# Patient Record
Sex: Female | Born: 1940 | Race: White | Hispanic: No | Smoking: Former smoker
Health system: Southern US, Community
[De-identification: ages and names within clinical notes are randomized; demographics above are authoritative.]

## PROBLEM LIST (undated history)

## (undated) DIAGNOSIS — J449 Chronic obstructive pulmonary disease, unspecified: Secondary | ICD-10-CM

## (undated) DIAGNOSIS — I2510 Atherosclerotic heart disease of native coronary artery without angina pectoris: Secondary | ICD-10-CM

## (undated) DIAGNOSIS — I10 Essential (primary) hypertension: Secondary | ICD-10-CM

---

## 2008-02-03 ENCOUNTER — Ambulatory Visit: Payer: Self-pay | Admitting: Family Medicine

## 2009-01-31 ENCOUNTER — Ambulatory Visit: Payer: Self-pay | Admitting: Urology

## 2009-02-10 ENCOUNTER — Ambulatory Visit: Payer: Self-pay | Admitting: Orthopedic Surgery

## 2009-08-04 ENCOUNTER — Ambulatory Visit: Payer: Self-pay | Admitting: Gastroenterology

## 2009-10-04 ENCOUNTER — Ambulatory Visit: Payer: Self-pay | Admitting: Family Medicine

## 2010-10-19 ENCOUNTER — Ambulatory Visit: Payer: Self-pay | Admitting: Family Medicine

## 2010-10-24 ENCOUNTER — Ambulatory Visit: Payer: Self-pay | Admitting: Family Medicine

## 2011-02-16 ENCOUNTER — Ambulatory Visit: Payer: Self-pay | Admitting: Specialist

## 2011-06-05 ENCOUNTER — Ambulatory Visit: Payer: Self-pay | Admitting: Specialist

## 2012-06-03 ENCOUNTER — Ambulatory Visit: Payer: Self-pay | Admitting: Specialist

## 2012-06-03 LAB — CREATININE, SERUM
EGFR (African American): >60
EGFR (Non-African Amer.): >60

## 2012-09-17 ENCOUNTER — Ambulatory Visit: Payer: Self-pay | Admitting: Specialist

## 2012-11-19 ENCOUNTER — Ambulatory Visit: Payer: Self-pay | Admitting: Family Medicine

## 2013-01-21 ENCOUNTER — Ambulatory Visit: Payer: Self-pay | Admitting: Specialist

## 2013-09-08 ENCOUNTER — Ambulatory Visit: Payer: Self-pay | Admitting: Urology

## 2013-09-08 LAB — DIFFERENTIAL
Basophil #: 0 10*3/uL (ref 0.0–0.1)
Basophil %: 0.6 %
Eosinophil #: 0.3 10*3/uL (ref 0.0–0.7)
Eosinophil %: 3.3 %
Lymphocyte %: 28.8 %
Neutrophil #: 4.8 10*3/uL (ref 1.4–6.5)
Neutrophil %: 60.7 %

## 2013-09-08 LAB — CBC
HCT: 41.2 % (ref 35.0–47.0)
HGB: 13.6 g/dL (ref 12.0–16.0)
MCHC: 33 g/dL (ref 32.0–36.0)
Platelet: 238 10*3/uL (ref 150–440)
RBC: 4.61 10*6/uL (ref 3.80–5.20)
RDW: 13.6 % (ref 11.5–14.5)
WBC: 7.8 10*3/uL (ref 3.6–11.0)

## 2013-09-08 LAB — BASIC METABOLIC PANEL
Anion Gap: 2 — ABNORMAL LOW (ref 7–16)
BUN: 18 mg/dL (ref 7–18)
Chloride: 102 mmol/L (ref 98–107)
Co2: 35 mmol/L — ABNORMAL HIGH (ref 21–32)
Creatinine: 0.67 mg/dL (ref 0.60–1.30)
EGFR (Non-African Amer.): >60
Glucose: 90 mg/dL (ref 65–99)
Osmolality: 279 (ref 275–301)
Potassium: 4.1 mmol/L (ref 3.5–5.1)
Sodium: 139 mmol/L (ref 136–145)

## 2013-09-15 ENCOUNTER — Ambulatory Visit: Payer: Self-pay | Admitting: Urology

## 2013-12-01 ENCOUNTER — Ambulatory Visit: Payer: Self-pay | Admitting: Family Medicine

## 2014-01-06 ENCOUNTER — Ambulatory Visit: Payer: Self-pay | Admitting: Specialist

## 2014-04-23 IMAGING — CT CT CHEST W/O CM
1 of 2 series · 14 of 32 positions shown, 18 images · non-contrast
Comparison: none

REASON FOR EXAM: Pulmonary nodules
COMMENTS:

[Series 2: chest w/o 3.0 i31f 2 · axial · non-contrast · 0.65mm/px · z∈[-679,-442]mm · 14 of 95 slices shown, 18 images]
[im 8/95  mediastinal]
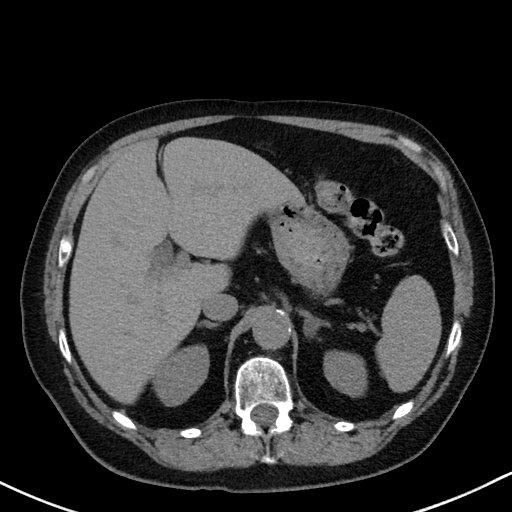
[im 8/95  lung]
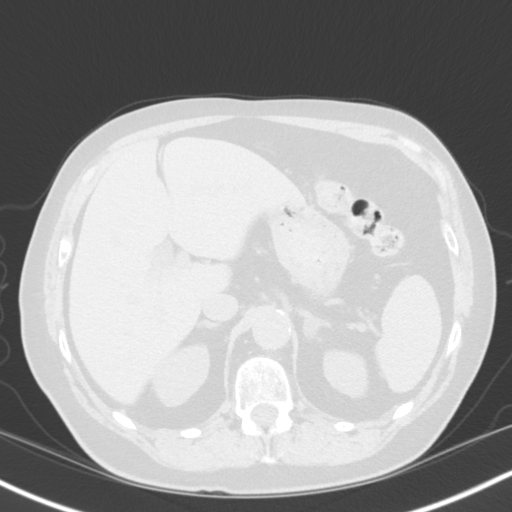
[im 15/95  lung]
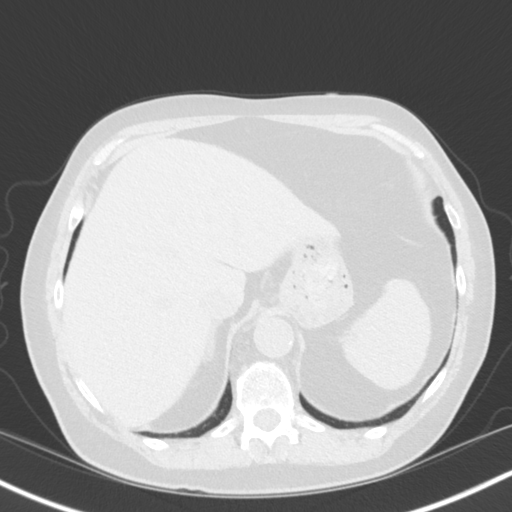
[im 22/95  lung]
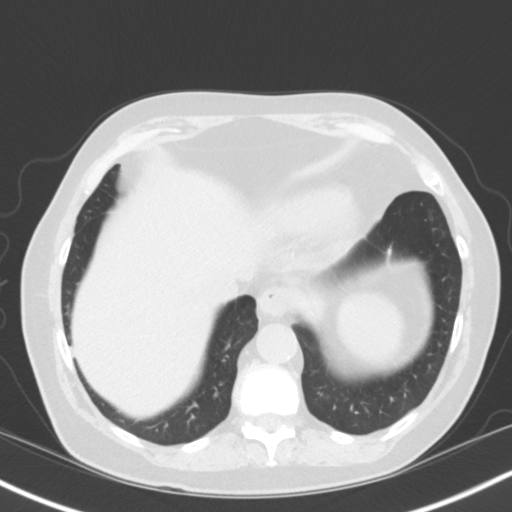
[im 29/95  lung]
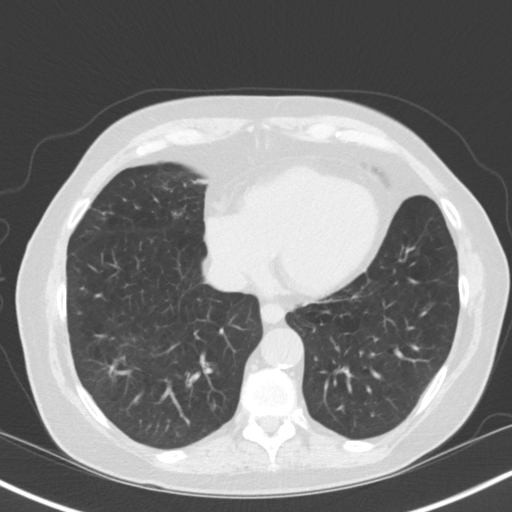
[im 37/95  mediastinal]
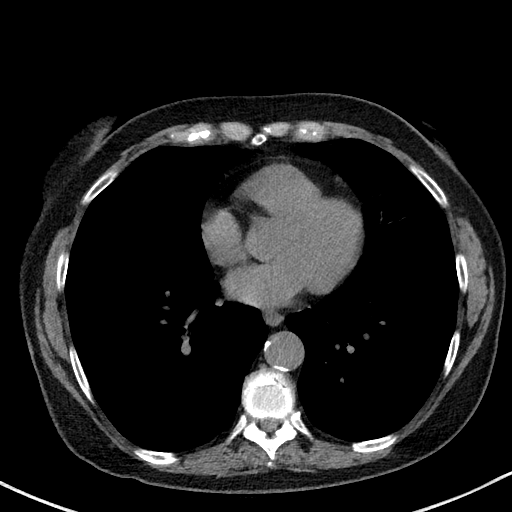
[im 37/95  lung]
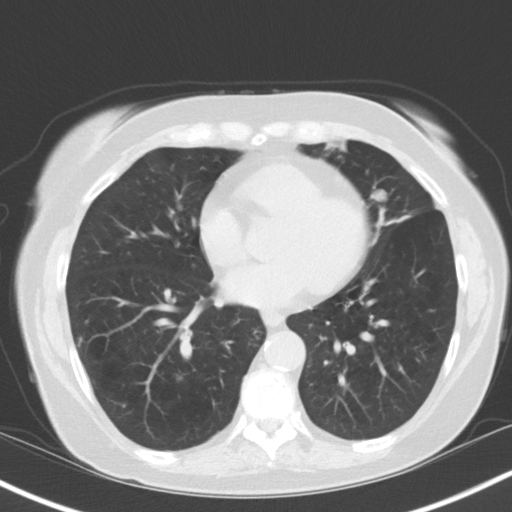
[im 44/95  lung]
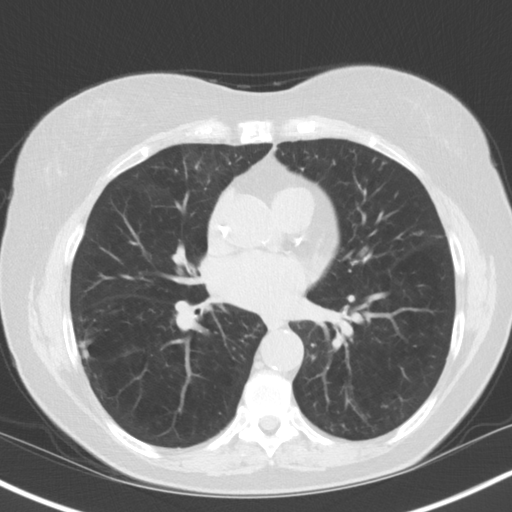
[im 45/95  lung]
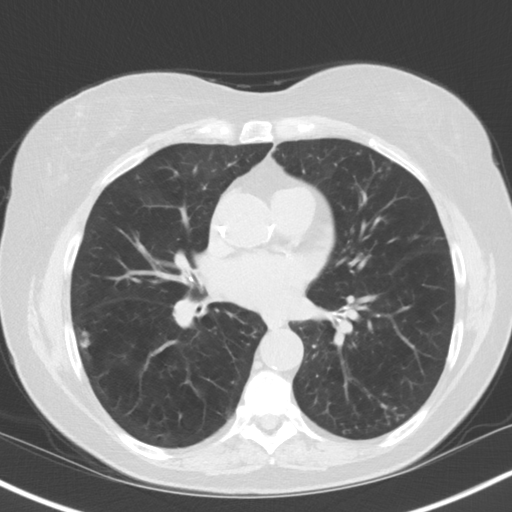
[im 48/95  lung]
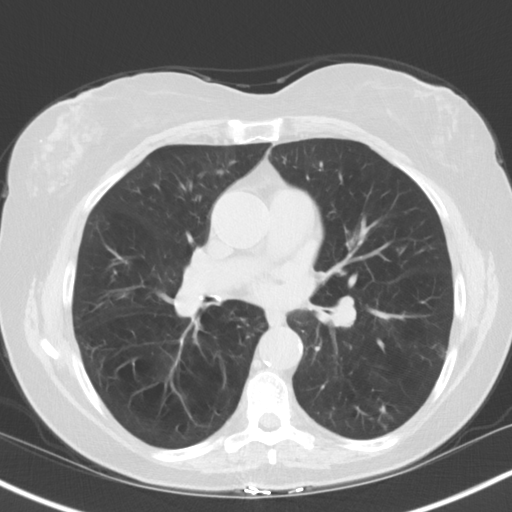
[im 51/95  mediastinal]
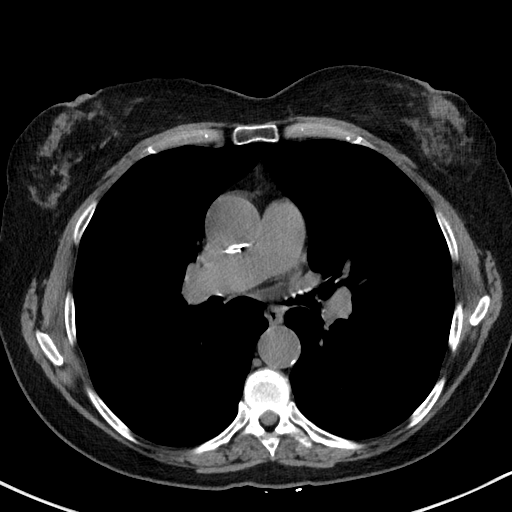
[im 51/95  lung]
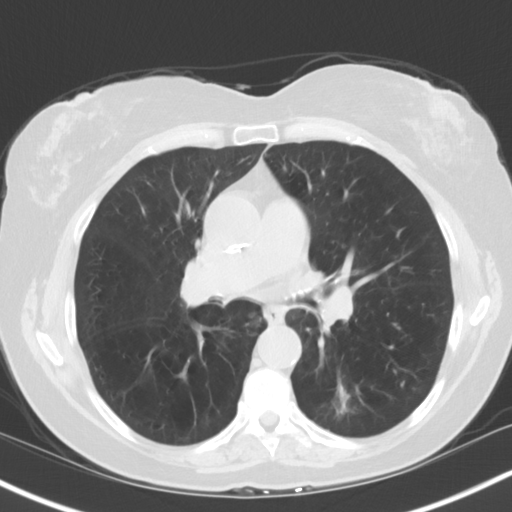
[im 58/95  lung]
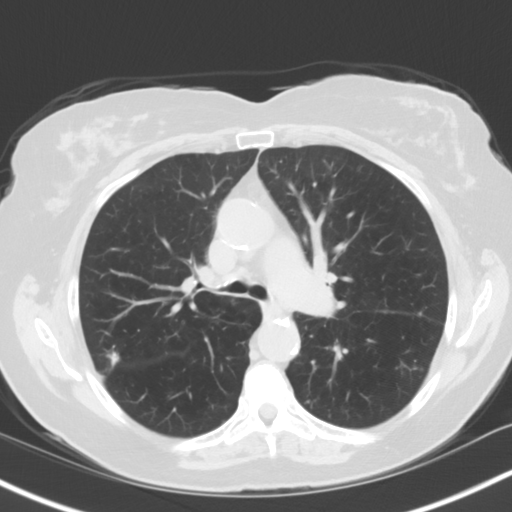
[im 66/95  lung]
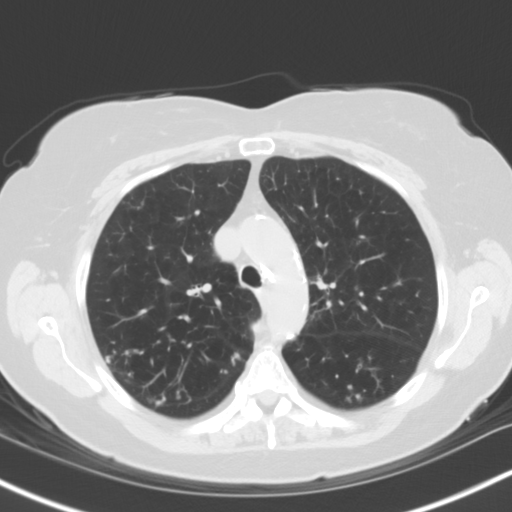
[im 73/95  lung]
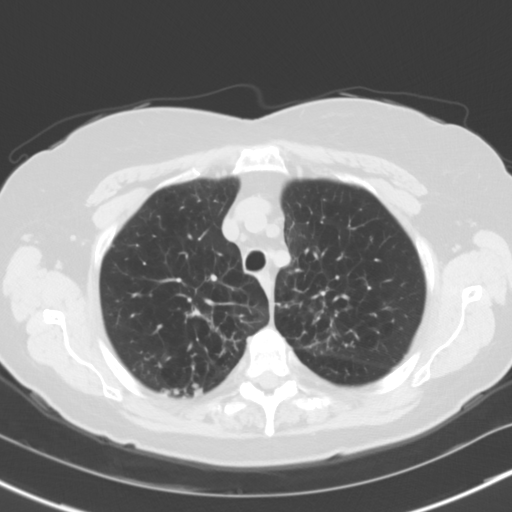
[im 80/95  mediastinal]
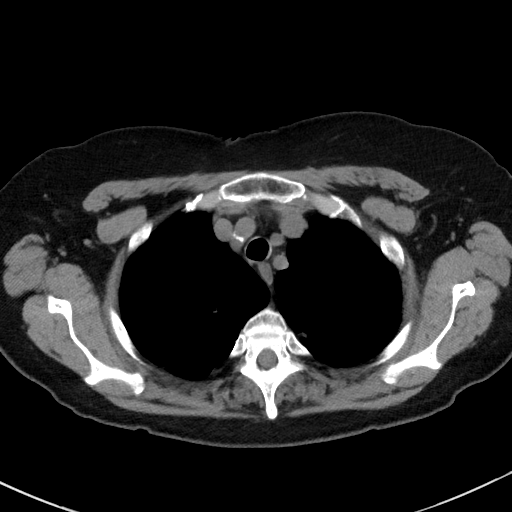
[im 80/95  lung]
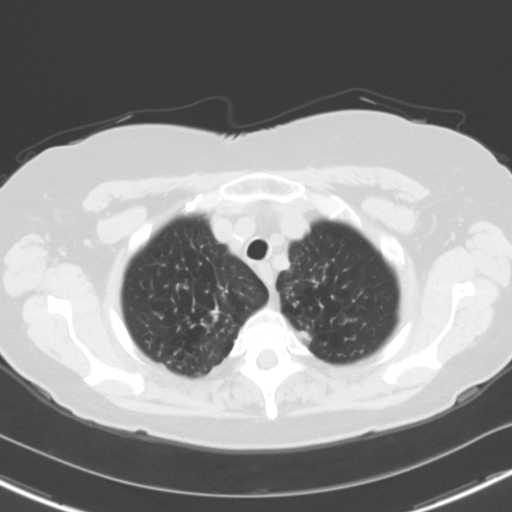
[im 87/95  lung]
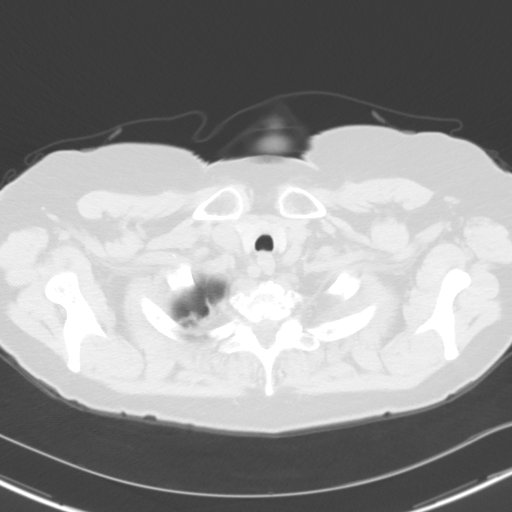

[14 of 32 positions shown; findings below may reference images not displayed]

PROCEDURE:     KCT - KCT CHEST WITHOUT CONTRAST  - September 17, 2012  [DATE]

RESULT:     Chest CT without contrast is reconstructed at 3.0 mm slice
thickness and compared to the previous examinations dated 03 June, 2012
and 05 June, 2011.

Emphysematous lung disease is present bilaterally. There is a stable
smoothly marginated elliptical nodular density in the lingula measuring
approximately 10.8 mm x 8.5 mm on image #58. This visually is stable. There
is a new small area of nodular density anterior to the superior portion of
the major fissure on the left side on image #20 measuring 6 mm. Scattered
areas of nodularity are seen fairly diffusely but somewhat more concentrated
in the posterior right upper lobe especially in the area of image #23. There
is slightly decreased irregular density in the right upper lobe anterior to
the major fissure along the chest wall laterally. There is an irregularly
marginated area of nodular density in the left lower lobe on images #40 to
image #45, which is slightly more prominent compared to the most recent
study. This is difficult to accurately measure given the significant
marginal irregularity. There is also new irregular nodular density in the
paraspinal region of the right lower lobe on image #28 measuring up to
mm by 4.6 mm. There is nodularity in the superior segment of the left lower
lobe not seen on the most recent study visualized today on image #27 and
measuring 3.7 mm at lung window settings. There does not appear to be
pneumothorax, definite pneumonia or effusion. Pleural-based nodular density
on image #60 in the anterior paramediastinal lingula measures as much as 14
mm parallel to the pleura and appears to be unchanged. There is no
mediastinal or hilar mass. Nonenlarged mediastinal and hilar lymph nodes are
present. The included upper abdominal structures appear unremarkable.
IMPRESSION: 1. Findings consistent with emphysematous lung disease and chronic pulmonary
disease of multiple areas of nodular density including some new areas of
nodularity. Other areas appear to be stable or slightly improved. Findings
can be consistent with chronic pulmonary disease and some areas of fibrosis.
Close clinical and CT follow-up is recommended. The appearance has worsened
compared to both of the two previous exams. The study is not definitely
indicative of malignant disease but that is a strong consideration requiring
close follow-up.

[REDACTED]

## 2015-01-21 NOTE — Op Note (Signed)
PATIENT NAME:  Autumn Moore, Autumn Moore MR#:  [id number] DATE OF BIRTH:  [birth year]  DATE OF PROCEDURE:  09/15/2013  PRINCIPAL DIAGNOSES: Bladder tumor, bladder calculi.   POSTOPERATIVE DIAGNOSES: Bladder tumor, bladder calculi.   PROCEDURE: Cystoscopy, bladder biopsy, removal of bladder stones, mitomycin bladder instillation.   SURGEON: Edrick Oh, M.D.   ANESTHESIA: Spinal.  INDICATIONS: The patient is a 74 year old white female with a history of bladder cancer. She underwent recent surveillance cystoscopy demonstrating 2 raised irregular early papillary-appearing lesions on the left lateral wall just behind the ureteral orifice. No other appreciable abnormalities were noted except for multiple small stones on the bladder base and a small cystocele region. The patient presents for biopsy and removal of the bladder stones.   DESCRIPTION OF PROCEDURE: After informed consent was obtained, the patient was taken to the operating room and placed in the dorsal lithotomy position under spinal anesthesia with adequate levels. The patient was then prepped and draped in the usual standard fashion. The 22-French rigid cystoscope was introduced into the urethra under direct vision with no urethral abnormalities noted. Upon entering the bladder, the mucosa was inspected in its entirety. There were approximately 2 lesions each 7 to 8 mm in size on the left lateral wall just behind the left ureteral orifice that were raised with early papillary-appearing irregularities. Prominent hypervascularity was also present. These findings are worrisome for early papillary tumor. No additional bladder abnormalities were noted under direct visualization. The narrow band imaging was utilized for further evaluation. A third area was noted on the posterior bladder wall just slightly right of midline. Cold cup biopsy forceps were utilized to remove the 2 left lateral lesions. A biopsy was also taken of the posterior lesion. The areas were  then extensively cauterized utilizing the Bugbee electrode. The bladder was then irrigated and the small stones were removed in their entirety. These were collected and will be sent for stone analysis. The pathology specimens will also be sent for pathology analysis, labeled left lateral wall and posterior bladder wall. Upon completion of the procedure, the bladder was drained, the cystoscope was removed, and mitomycin 20 mg was reconstituted in 50 mL of sterile water. It was instilled into the urinary bladder through an 18-French red rubber catheter. Proper chemotherapy precautions were followed. The catheter was removed upon instillation. The patient was then taken to the recovery room in stable condition. There were no problems or complications. The patient tolerated the procedure well. ____________________________ Denice Bors. Jacqlyn Larsen, MD bsc:sb D: 09/15/2013 14:08:27 ET T: 09/15/2013 15:16:33 ET JOB#: [id number]  cc: Denice Bors. Jacqlyn Larsen, MD, <Dictator> Denice Bors Rahkeem Senft MD ELECTRONICALLY SIGNED 09/15/2013 16:22

## 2015-03-07 ENCOUNTER — Ambulatory Visit: Payer: Medicare Other | Admitting: Anesthesiology

## 2015-03-07 ENCOUNTER — Encounter: Payer: Self-pay | Admitting: *Deleted

## 2015-03-07 ENCOUNTER — Encounter
Admission: RE | Disposition: A | Discharge status: Home / Self Care | Payer: Self-pay | Source: Ambulatory Visit | Attending: Gastroenterology

## 2015-03-07 ENCOUNTER — Ambulatory Visit
Admission: RE | Admit: 2015-03-07 | Discharge: 2015-03-07 | Disposition: A | Discharge status: Home / Self Care | Payer: Medicare Other | Source: Ambulatory Visit | Attending: Gastroenterology | Admitting: Gastroenterology

## 2015-03-07 DIAGNOSIS — K22719 Barrett's esophagus with dysplasia, unspecified: Secondary | ICD-10-CM | POA: Diagnosis present

## 2015-03-07 DIAGNOSIS — K5730 Diverticulosis of large intestine without perforation or abscess without bleeding: Secondary | ICD-10-CM | POA: Diagnosis not present

## 2015-03-07 DIAGNOSIS — Z7951 Long term (current) use of inhaled steroids: Secondary | ICD-10-CM | POA: Insufficient documentation

## 2015-03-07 DIAGNOSIS — Z86010 Personal history of colonic polyps: Secondary | ICD-10-CM | POA: Diagnosis present

## 2015-03-07 DIAGNOSIS — Z7982 Long term (current) use of aspirin: Secondary | ICD-10-CM | POA: Insufficient documentation

## 2015-03-07 DIAGNOSIS — D125 Benign neoplasm of sigmoid colon: Secondary | ICD-10-CM | POA: Insufficient documentation

## 2015-03-07 DIAGNOSIS — R159 Full incontinence of feces: Secondary | ICD-10-CM | POA: Insufficient documentation

## 2015-03-07 DIAGNOSIS — I10 Essential (primary) hypertension: Secondary | ICD-10-CM | POA: Diagnosis not present

## 2015-03-07 DIAGNOSIS — Z8551 Personal history of malignant neoplasm of bladder: Secondary | ICD-10-CM | POA: Diagnosis not present

## 2015-03-07 DIAGNOSIS — E785 Hyperlipidemia, unspecified: Secondary | ICD-10-CM | POA: Diagnosis not present

## 2015-03-07 DIAGNOSIS — I480 Paroxysmal atrial fibrillation: Secondary | ICD-10-CM | POA: Insufficient documentation

## 2015-03-07 DIAGNOSIS — Z85828 Personal history of other malignant neoplasm of skin: Secondary | ICD-10-CM | POA: Insufficient documentation

## 2015-03-07 DIAGNOSIS — M8580 Other specified disorders of bone density and structure, unspecified site: Secondary | ICD-10-CM | POA: Insufficient documentation

## 2015-03-07 DIAGNOSIS — A6000 Herpesviral infection of urogenital system, unspecified: Secondary | ICD-10-CM | POA: Diagnosis not present

## 2015-03-07 DIAGNOSIS — Z79899 Other long term (current) drug therapy: Secondary | ICD-10-CM | POA: Diagnosis not present

## 2015-03-07 DIAGNOSIS — K2270 Barrett's esophagus without dysplasia: Secondary | ICD-10-CM | POA: Insufficient documentation

## 2015-03-07 HISTORY — PX: COLONOSCOPY WITH PROPOFOL: SHX5780

## 2015-03-07 HISTORY — PX: ESOPHAGOGASTRODUODENOSCOPY: SHX5428

## 2015-03-07 HISTORY — DX: Essential (primary) hypertension: I10

## 2015-03-07 HISTORY — DX: Chronic obstructive pulmonary disease, unspecified: J44.9

## 2015-03-07 SURGERY — COLONOSCOPY WITH PROPOFOL
Anesthesia: General

## 2015-03-07 MED ORDER — PROPOFOL 10 MG/ML IV BOLUS
INTRAVENOUS | Status: DC | PRN
  Administered 2015-03-07: 50 mg via INTRAVENOUS

## 2015-03-07 MED ORDER — FENTANYL CITRATE (PF) 100 MCG/2ML IJ SOLN
INTRAMUSCULAR | Status: DC | PRN
  Administered 2015-03-07 (×2): 25 ug via INTRAVENOUS

## 2015-03-07 MED ORDER — SODIUM CHLORIDE 0.9 % IV SOLN
INTRAVENOUS | Status: DC
  Administered 2015-03-07: 08:00:00 via INTRAVENOUS

## 2015-03-07 MED ORDER — GLYCOPYRROLATE 0.2 MG/ML IJ SOLN
INTRAMUSCULAR | Status: DC | PRN
  Administered 2015-03-07: 0.2 mg via INTRAVENOUS

## 2015-03-07 MED ORDER — PHENYLEPHRINE HCL 10 MG/ML IJ SOLN
INTRAMUSCULAR | Status: DC | PRN
  Administered 2015-03-07: 100 ug via INTRAVENOUS

## 2015-03-07 MED ORDER — PROPOFOL INFUSION 10 MG/ML OPTIME
INTRAVENOUS | Status: DC | PRN
  Administered 2015-03-07: 75 ug/kg/min via INTRAVENOUS

## 2015-03-07 MED ORDER — LIDOCAINE HCL (CARDIAC) 20 MG/ML IV SOLN
INTRAVENOUS | Status: DC | PRN
  Administered 2015-03-07: 50 mg via INTRAVENOUS

## 2015-03-07 NOTE — Anesthesia Postprocedure Evaluation (Signed)
  Anesthesia Post-op Note  Patient: Autumn Moore  Procedure(s) Performed: Procedure(s): COLONOSCOPY WITH PROPOFOL (N/A) ESOPHAGOGASTRODUODENOSCOPY (EGD) (N/A)  Anesthesia type:General  Patient location: PACU  Post pain: Pain level controlled  Post assessment: Post-op Vital signs reviewed, Patient's Cardiovascular Status Stable, Respiratory Function Stable, Patent Airway and No signs of Nausea or vomiting  Post vital signs: Reviewed and stable  Last Vitals:  Filed Vitals:   03/07/15 0910  BP: 110/62  Pulse: 70  Temp:   Resp: 18    Level of consciousness: awake, alert  and patient cooperative  Complications: No apparent anesthesia complications

## 2015-03-07 NOTE — Transfer of Care (Signed)
Immediate Anesthesia Transfer of Care Note  Patient: Autumn Moore  Procedure(s) Performed: Procedure(s): COLONOSCOPY WITH PROPOFOL (N/A) ESOPHAGOGASTRODUODENOSCOPY (EGD) (N/A)  Patient Location: PACU/ENDO  Anesthesia Type:General  Level of Consciousness: Alert, Awake, Oriented  Airway & Oxygen Therapy: Patient Spontanous Breathing  Post-op Assessment: Report given to RN  Post vital signs: Reviewed and stable  Last Vitals:  Filed Vitals:   03/07/15 0848  BP: 106/56  Pulse: 77  Temp: 36.4 C  Resp: 12    Complications: No apparent anesthesia complications

## 2015-03-07 NOTE — Op Note (Signed)
[facility] Gastroenterology Patient Name: Autumn Moore Procedure Date: 03/07/2015 8:19 AM MRN: [id number] Account #: [id number] Date of Birth: Nov 01, 1940 Admit Type: Outpatient Age: 74 Room: [facility] ENDO ROOM 4 Gender: Female Note Status: Finalized Procedure:         Upper GI endoscopy Indications:       Follow-up of Barrett's esophagus Providers:         Lupita Dawn. Candace Cruise, MD Referring MD:      Irven Easterly. Kary Kos, MD (Referring MD) Medicines:         Monitored Anesthesia Care Complications:     No immediate complications. Procedure:         Pre-Anesthesia Assessment:                    - Prior to the procedure, a History and Physical was                     performed, and patient medications, allergies and                     sensitivities were reviewed. The patient's tolerance of                     previous anesthesia was reviewed.                    - The risks and benefits of the procedure and the sedation                     options and risks were discussed with the patient. All                     questions were answered and informed consent was obtained.                    - After reviewing the risks and benefits, the patient was                     deemed in satisfactory condition to undergo the procedure.                    After obtaining informed consent, the endoscope was passed                     under direct vision. Throughout the procedure, the                     patient's blood pressure, pulse, and oxygen saturations                     were monitored continuously. The Olympus GIF-160 endoscope                     (S#. [id number]) was introduced through the mouth, and                     advanced to the second part of duodenum. The upper GI                     endoscopy was accomplished without difficulty. The patient                     tolerated the procedure well. Findings:      There were esophageal mucosal changes consistent with short-segment  Barrett's esophagus present in the lower third of the esophagus. The       maximum longitudinal extent of these mucosal changes was 2 cm in length.       Mucosa was biopsied with a cold forceps for histology in 4 quadrants in       the lower third of the esophagus. One specimen bottle was sent to       pathology.      The exam was otherwise without abnormality.      The entire examined stomach was normal.      The examined duodenum was normal. Impression:        - Esophageal mucosal changes consistent with short-segment                     Barrett's esophagus. Biopsied.                    - The examination was otherwise normal.                    - Normal stomach.                    - Normal examined duodenum. Recommendation:    - Discharge patient to home.                    - Observe patient's clinical course.                    - Continue present medications.                    - Await pathology results.                    - The findings and recommendations were discussed with the                     patient. Procedure Code(s): --- Professional ---                    [id number], Esophagogastroduodenoscopy, flexible, transoral;                     with biopsy, single or multiple Diagnosis Code(s): --- Professional ---                    K22.70, Barrett's esophagus without dysplasia CPT copyright 2014 American Medical Association. All rights reserved. The codes documented in this report are preliminary and upon coder review may  be revised to meet current compliance requirements. Hulen Luster, MD 03/07/2015 8:29:42 AM This report has been signed electronically. Number of Addenda: 0 Note Initiated On: 03/07/2015 8:19 AM      [facility]

## 2015-03-07 NOTE — H&P (Signed)
  Date of Initial H&P: 02/16/2015  History reviewed, patient examined, no change in status, stable for surgery.

## 2015-03-07 NOTE — Op Note (Signed)
[facility] Gastroenterology Patient Name: Autumn Moore Procedure Date: 03/07/2015 8:19 AM MRN: [id number] Account #: [id number] Date of Birth: 11-28-1940 Admit Type: Outpatient Age: 74 Room: [facility] ENDO ROOM 4 Gender: Female Note Status: Finalized Procedure:         Colonoscopy Indications:       Chronic diarrhea, Personal history of colonic polyps,                     fecal incontinence Providers:         Lupita Dawn. Candace Cruise, MD Referring MD:      Irven Easterly. Kary Kos, MD (Referring MD) Medicines:         Monitored Anesthesia Care Complications:     No immediate complications. Procedure:         Pre-Anesthesia Assessment:                    - Prior to the procedure, a History and Physical was                     performed, and patient medications, allergies and                     sensitivities were reviewed. The patient's tolerance of                     previous anesthesia was reviewed.                    - The risks and benefits of the procedure and the sedation                     options and risks were discussed with the patient. All                     questions were answered and informed consent was obtained.                    - After reviewing the risks and benefits, the patient was                     deemed in satisfactory condition to undergo the procedure.                    After obtaining informed consent, the colonoscope was                     passed under direct vision. Throughout the procedure, the                     patient's blood pressure, pulse, and oxygen saturations                     were monitored continuously. The Olympus PCF-160AL                     Colonoscope (S# [id number]) was introduced through the anus                     and advanced to the the terminal ileum, with                     identification of the appendiceal orifice and IC valve.  The colonoscopy was performed without difficulty. The   patient tolerated the procedure well. The quality of the                     bowel preparation was fair. Findings:      A few small and large-mouthed diverticula were found in the entire colon.      A small polyp was found in the sigmoid colon. The polyp was sessile. The       polyp was removed with a hot snare. Resection and retrieval were       complete. Biopsies for histology were taken with a cold forceps from the       entire colon for evaluation of microscopic colitis.      The terminal ileum appeared normal. Biopsies were taken with a cold       forceps for histology.      The exam was otherwise normal throughout the examined colon. Impression:        - Diverticulosis in the entire examined colon.                    - One small polyp in the sigmoid colon. Resected and                     retrieved. Biopsied.                    - The examined portion of the ileum was normal. Biopsied. Recommendation:    - Discharge patient to home.                    - Await pathology results.                    - The findings and recommendations were discussed with the                     patient. Procedure Code(s): --- Professional ---                    [phone], Colonoscopy, flexible; with removal of tumor(s),                     polyp(s), or other lesion(s) by snare technique                    45380, 6, Colonoscopy, flexible; with biopsy, single or                     multiple Diagnosis Code(s): --- Professional ---                    D12.5, Benign neoplasm of sigmoid colon                    K52.9, Noninfective gastroenteritis and colitis,                     unspecified                    Z86.010, Personal history of colonic polyps                    K57.30, Diverticulosis of large intestine without                     perforation or abscess without bleeding CPT copyright  2014 American Medical Association. All rights reserved. The codes documented in this report are preliminary and upon  coder review may  be revised to meet current compliance requirements. Hulen Luster, MD 03/07/2015 8:45:50 AM This report has been signed electronically. Number of Addenda: 0 Note Initiated On: 03/07/2015 8:19 AM Scope Withdrawal Time: 0 hours 8 minutes 48 seconds  Total Procedure Duration: 0 hours 10 minutes 53 seconds       [facility]

## 2015-03-07 NOTE — Anesthesia Preprocedure Evaluation (Signed)
Anesthesia Evaluation  Patient identified by MRN, date of birth, ID band Patient awake    Reviewed: Allergy & Precautions, NPO status , Patient's Chart, lab work & pertinent test results, reviewed documented beta blocker date and time   Airway Mallampati: II  TM Distance: <3 FB Neck ROM: Limited   Comment: Small mouth Dental  (+) Upper Dentures, Lower Dentures   Pulmonary COPD COPD inhaler,  breath sounds clear to auscultation Distant BS Pulmonary exam normal       Cardiovascular hypertension, Normal cardiovascular examRhythm:Regular Rate:Normal     Neuro/Psych negative neurological ROS  negative psych ROS   GI/Hepatic negative GI ROS, Neg liver ROS,   Endo/Other  negative endocrine ROS  Renal/GU negative Renal ROS  negative genitourinary   Musculoskeletal negative musculoskeletal ROS (+)   Abdominal Normal abdominal exam  (+)   Peds negative pediatric ROS (+)  Hematology negative hematology ROS (+)   Anesthesia Other Findings   Reproductive/Obstetrics                             Anesthesia Physical Anesthesia Plan  ASA: III  Anesthesia Plan: General   Post-op Pain Management:    Induction: Intravenous  Airway Management Planned: Nasal Cannula  Additional Equipment:   Intra-op Plan:   Post-operative Plan:   Informed Consent: I have reviewed the patients History and Physical, chart, labs and discussed the procedure including the risks, benefits and alternatives for the proposed anesthesia with the patient or authorized representative who has indicated his/her understanding and acceptance.     Plan Discussed with: CRNA and Surgeon  Anesthesia Plan Comments:         Anesthesia Quick Evaluation

## 2015-03-09 LAB — SURGICAL PATHOLOGY

## 2015-03-10 ENCOUNTER — Encounter: Payer: Self-pay | Admitting: Gastroenterology

## 2015-11-19 ENCOUNTER — Encounter: Payer: Self-pay | Admitting: *Deleted

## 2015-11-19 ENCOUNTER — Emergency Department: Payer: Medicare Other

## 2015-11-19 ENCOUNTER — Inpatient Hospital Stay
Admission: EM | Admit: 2015-11-19 | Discharge: 2015-11-23 | DRG: 190 | Disposition: A | Discharge status: Home / Self Care | Payer: Medicare Other | Attending: Internal Medicine | Admitting: Internal Medicine

## 2015-11-19 DIAGNOSIS — I2510 Atherosclerotic heart disease of native coronary artery without angina pectoris: Secondary | ICD-10-CM | POA: Diagnosis present

## 2015-11-19 DIAGNOSIS — R197 Diarrhea, unspecified: Secondary | ICD-10-CM | POA: Diagnosis not present

## 2015-11-19 DIAGNOSIS — Z79899 Other long term (current) drug therapy: Secondary | ICD-10-CM

## 2015-11-19 DIAGNOSIS — T380X5A Adverse effect of glucocorticoids and synthetic analogues, initial encounter: Secondary | ICD-10-CM | POA: Diagnosis present

## 2015-11-19 DIAGNOSIS — Z9981 Dependence on supplemental oxygen: Secondary | ICD-10-CM

## 2015-11-19 DIAGNOSIS — J440 Chronic obstructive pulmonary disease with acute lower respiratory infection: Secondary | ICD-10-CM | POA: Diagnosis present

## 2015-11-19 DIAGNOSIS — Z23 Encounter for immunization: Secondary | ICD-10-CM | POA: Diagnosis not present

## 2015-11-19 DIAGNOSIS — J9621 Acute and chronic respiratory failure with hypoxia: Secondary | ICD-10-CM

## 2015-11-19 DIAGNOSIS — I10 Essential (primary) hypertension: Secondary | ICD-10-CM | POA: Diagnosis not present

## 2015-11-19 DIAGNOSIS — Z9889 Other specified postprocedural states: Secondary | ICD-10-CM

## 2015-11-19 DIAGNOSIS — Z8249 Family history of ischemic heart disease and other diseases of the circulatory system: Secondary | ICD-10-CM

## 2015-11-19 DIAGNOSIS — J441 Chronic obstructive pulmonary disease with (acute) exacerbation: Secondary | ICD-10-CM | POA: Diagnosis not present

## 2015-11-19 DIAGNOSIS — Z7982 Long term (current) use of aspirin: Secondary | ICD-10-CM | POA: Diagnosis not present

## 2015-11-19 DIAGNOSIS — R739 Hyperglycemia, unspecified: Secondary | ICD-10-CM | POA: Diagnosis present

## 2015-11-19 DIAGNOSIS — J9601 Acute respiratory failure with hypoxia: Secondary | ICD-10-CM | POA: Diagnosis not present

## 2015-11-19 DIAGNOSIS — Z87891 Personal history of nicotine dependence: Secondary | ICD-10-CM

## 2015-11-19 DIAGNOSIS — J189 Pneumonia, unspecified organism: Secondary | ICD-10-CM | POA: Diagnosis present

## 2015-11-19 DIAGNOSIS — J9600 Acute respiratory failure, unspecified whether with hypoxia or hypercapnia: Secondary | ICD-10-CM

## 2015-11-19 HISTORY — DX: Atherosclerotic heart disease of native coronary artery without angina pectoris: I25.10

## 2015-11-19 LAB — BASIC METABOLIC PANEL
ANION GAP: 3 — AB (ref 5–15)
BUN: 24 mg/dL — ABNORMAL HIGH (ref 6–20)
CO2: 41 mmol/L — ABNORMAL HIGH (ref 22–32)
Calcium: 8.9 mg/dL (ref 8.9–10.3)
Chloride: 99 mmol/L — ABNORMAL LOW (ref 101–111)
Creatinine, Ser: 0.64 mg/dL (ref 0.44–1.00)
Glucose, Bld: 127 mg/dL — ABNORMAL HIGH (ref 65–99)
POTASSIUM: 4.2 mmol/L (ref 3.5–5.1)
SODIUM: 143 mmol/L (ref 135–145)

## 2015-11-19 LAB — CBC
HEMATOCRIT: 37.2 % (ref 35.0–47.0)
Hemoglobin: 12 g/dL (ref 12.0–16.0)
MCH: 29.3 pg (ref 26.0–34.0)
MCHC: 32.3 g/dL (ref 32.0–36.0)
MCV: 90.7 fL (ref 80.0–100.0)
Platelets: 149 10*3/uL — ABNORMAL LOW (ref 150–440)
RBC: 4.1 MIL/uL (ref 3.80–5.20)
RDW: 14.4 % (ref 11.5–14.5)
WBC: 5.9 10*3/uL (ref 3.6–11.0)

## 2015-11-19 LAB — RAPID INFLUENZA A&B ANTIGENS (ARMC ONLY): INFLUENZA A (ARMC): NEGATIVE

## 2015-11-19 LAB — RAPID INFLUENZA A&B ANTIGENS: Influenza B (ARMC): NEGATIVE

## 2015-11-19 LAB — TROPONIN I: Troponin I: <0.03 ng/mL (ref <0.031)

## 2015-11-19 MED ORDER — VITAMIN C 500 MG PO TABS
500.0000 mg | ORAL_TABLET | Freq: Every day | ORAL | Status: DC
  Administered 2015-11-19 – 2015-11-23 (×5): 500 mg via ORAL
  Filled 2015-11-19 (×5): qty 1

## 2015-11-19 MED ORDER — BUDESONIDE-FORMOTEROL FUMARATE 160-4.5 MCG/ACT IN AERO
2.0000 | INHALATION_SPRAY | Freq: Two times a day (BID) | RESPIRATORY_TRACT | Status: DC
  Administered 2015-11-20 – 2015-11-23 (×7): 2 via RESPIRATORY_TRACT
  Filled 2015-11-19: qty 6

## 2015-11-19 MED ORDER — ACETAMINOPHEN 650 MG RE SUPP: 650.0000 mg | Freq: Four times a day (QID) | RECTAL | Status: DC | PRN

## 2015-11-19 MED ORDER — PNEUMOCOCCAL VAC POLYVALENT 25 MCG/0.5ML IJ INJ
0.5000 mL | INJECTION | INTRAMUSCULAR | Status: AC
  Administered 2015-11-21: 0.5 mL via INTRAMUSCULAR
  Filled 2015-11-19: qty 0.5

## 2015-11-19 MED ORDER — INFLUENZA VAC SPLIT QUAD 0.5 ML IM SUSY
0.5000 mL | PREFILLED_SYRINGE | INTRAMUSCULAR | Status: AC
  Administered 2015-11-21: 0.5 mL via INTRAMUSCULAR
  Filled 2015-11-19: qty 0.5

## 2015-11-19 MED ORDER — DOXYCYCLINE HYCLATE 100 MG PO TABS
100.0000 mg | ORAL_TABLET | Freq: Two times a day (BID) | ORAL | Status: DC
  Administered 2015-11-19: 100 mg via ORAL
  Filled 2015-11-19: qty 1

## 2015-11-19 MED ORDER — ONDANSETRON HCL 4 MG PO TABS: 4.0000 mg | ORAL_TABLET | Freq: Four times a day (QID) | ORAL | Status: DC | PRN

## 2015-11-19 MED ORDER — ASPIRIN EC 81 MG PO TBEC
81.0000 mg | DELAYED_RELEASE_TABLET | ORAL | Status: DC
  Administered 2015-11-21 – 2015-11-23 (×2): 81 mg via ORAL
  Filled 2015-11-19 (×3): qty 1

## 2015-11-19 MED ORDER — METHYLPREDNISOLONE SODIUM SUCC 125 MG IJ SOLR
125.0000 mg | Freq: Once | INTRAMUSCULAR | Status: AC
  Administered 2015-11-19: 125 mg via INTRAVENOUS
  Filled 2015-11-19: qty 2

## 2015-11-19 MED ORDER — DEXTROSE 5 % IV SOLN
1.0000 g | Freq: Once | INTRAVENOUS | Status: AC
  Administered 2015-11-19: 1 g via INTRAVENOUS
  Filled 2015-11-19: qty 10

## 2015-11-19 MED ORDER — AMLODIPINE BESYLATE 5 MG PO TABS
2.5000 mg | ORAL_TABLET | Freq: Every day | ORAL | Status: DC
  Administered 2015-11-20 – 2015-11-23 (×4): 2.5 mg via ORAL
  Filled 2015-11-19 (×5): qty 1

## 2015-11-19 MED ORDER — CARVEDILOL 6.25 MG PO TABS
6.2500 mg | ORAL_TABLET | Freq: Two times a day (BID) | ORAL | Status: DC
  Administered 2015-11-20 – 2015-11-23 (×7): 6.25 mg via ORAL
  Filled 2015-11-19 (×7): qty 1

## 2015-11-19 MED ORDER — GUAIFENESIN ER 600 MG PO TB12
600.0000 mg | ORAL_TABLET | Freq: Two times a day (BID) | ORAL | Status: DC
  Administered 2015-11-19 – 2015-11-23 (×8): 600 mg via ORAL
  Filled 2015-11-19 (×8): qty 1

## 2015-11-19 MED ORDER — ONDANSETRON HCL 4 MG/2ML IJ SOLN
4.0000 mg | Freq: Four times a day (QID) | INTRAMUSCULAR | Status: DC | PRN
Start: 2015-11-19 — End: 2015-11-23

## 2015-11-19 MED ORDER — DEXTROSE 5 % IV SOLN
500.0000 mg | Freq: Once | INTRAVENOUS | Status: AC
  Administered 2015-11-19: 500 mg via INTRAVENOUS
  Filled 2015-11-19: qty 500

## 2015-11-19 MED ORDER — ALBUTEROL SULFATE (2.5 MG/3ML) 0.083% IN NEBU
2.5000 mg | INHALATION_SOLUTION | Freq: Once | RESPIRATORY_TRACT | Status: AC
  Administered 2015-11-19: 2.5 mg via RESPIRATORY_TRACT
  Filled 2015-11-19: qty 3

## 2015-11-19 MED ORDER — DEXTROSE 5 % IV SOLN
1.0000 g | INTRAVENOUS | Status: DC
  Administered 2015-11-20 – 2015-11-22 (×3): 1 g via INTRAVENOUS
  Filled 2015-11-19 (×3): qty 10

## 2015-11-19 MED ORDER — TIOTROPIUM BROMIDE MONOHYDRATE 18 MCG IN CAPS
18.0000 ug | ORAL_CAPSULE | Freq: Every day | RESPIRATORY_TRACT | Status: DC
  Administered 2015-11-20 – 2015-11-23 (×4): 18 ug via RESPIRATORY_TRACT
  Filled 2015-11-19: qty 5

## 2015-11-19 MED ORDER — HEPARIN SODIUM (PORCINE) 5000 UNIT/ML IJ SOLN
5000.0000 [IU] | Freq: Three times a day (TID) | INTRAMUSCULAR | Status: DC
  Administered 2015-11-19 – 2015-11-21 (×5): 5000 [IU] via SUBCUTANEOUS
  Filled 2015-11-19 (×5): qty 1

## 2015-11-19 MED ORDER — IPRATROPIUM-ALBUTEROL 0.5-2.5 (3) MG/3ML IN SOLN
3.0000 mL | Freq: Four times a day (QID) | RESPIRATORY_TRACT | Status: DC
  Administered 2015-11-20 – 2015-11-23 (×13): 3 mL via RESPIRATORY_TRACT
  Filled 2015-11-19 (×12): qty 3

## 2015-11-19 MED ORDER — ALBUTEROL SULFATE (2.5 MG/3ML) 0.083% IN NEBU
2.5000 mg | INHALATION_SOLUTION | Freq: Four times a day (QID) | RESPIRATORY_TRACT | Status: DC | PRN
  Administered 2015-11-20: 2.5 mg via RESPIRATORY_TRACT
  Filled 2015-11-19: qty 3

## 2015-11-19 MED ORDER — SODIUM CHLORIDE 0.45 % IV SOLN
INTRAVENOUS | Status: DC
  Administered 2015-11-19 – 2015-11-20 (×2): via INTRAVENOUS

## 2015-11-19 MED ORDER — METHYLPREDNISOLONE SODIUM SUCC 125 MG IJ SOLR
60.0000 mg | Freq: Four times a day (QID) | INTRAMUSCULAR | Status: DC
  Administered 2015-11-20 – 2015-11-21 (×6): 60 mg via INTRAVENOUS
  Filled 2015-11-19 (×6): qty 2

## 2015-11-19 MED ORDER — PANTOPRAZOLE SODIUM 40 MG PO TBEC
40.0000 mg | DELAYED_RELEASE_TABLET | Freq: Every day | ORAL | Status: DC
  Administered 2015-11-19 – 2015-11-23 (×5): 40 mg via ORAL
  Filled 2015-11-19 (×5): qty 1

## 2015-11-19 MED ORDER — FAMOTIDINE IN NACL 20-0.9 MG/50ML-% IV SOLN
20.0000 mg | Freq: Two times a day (BID) | INTRAVENOUS | Status: DC
  Administered 2015-11-19 – 2015-11-21 (×4): 20 mg via INTRAVENOUS
  Filled 2015-11-19 (×6): qty 50

## 2015-11-19 MED ORDER — ACETAMINOPHEN 325 MG PO TABS: 650.0000 mg | ORAL_TABLET | Freq: Four times a day (QID) | ORAL | Status: DC | PRN

## 2015-11-19 MED ORDER — ADULT MULTIVITAMIN W/MINERALS CH
1.0000 | ORAL_TABLET | Freq: Every day | ORAL | Status: DC
  Administered 2015-11-19 – 2015-11-23 (×5): 1 via ORAL
  Filled 2015-11-19 (×5): qty 1

## 2015-11-19 MED ORDER — DEXTROSE 5 % IV SOLN
500.0000 mg | INTRAVENOUS | Status: DC
  Administered 2015-11-20 – 2015-11-21 (×2): 500 mg via INTRAVENOUS
  Filled 2015-11-19 (×2): qty 500

## 2015-11-19 MED ORDER — BISACODYL 5 MG PO TBEC: 5.0000 mg | DELAYED_RELEASE_TABLET | Freq: Every day | ORAL | Status: DC | PRN

## 2015-11-19 MED ORDER — DOCUSATE SODIUM 100 MG PO CAPS
100.0000 mg | ORAL_CAPSULE | Freq: Two times a day (BID) | ORAL | Status: DC
  Administered 2015-11-20 – 2015-11-22 (×4): 100 mg via ORAL
  Filled 2015-11-19 (×7): qty 1

## 2015-11-19 MED ORDER — MORPHINE SULFATE (PF) 2 MG/ML IV SOLN: 2.0000 mg | INTRAVENOUS | Status: DC | PRN

## 2015-11-19 NOTE — ED Notes (Signed)
Pt arrives to ER via [facility] from home c/o flu like sx and SOB progressively worsening over the week. Pt denies fever. Pt 75% on home oxygen upon EMS arrival. Pt given 1 duoneb en route, oxygen 99% on RA at this time. Pt alert and oriented X4, active, cooperative, pt in NAD. RR even and unlabored, color WNL.

## 2015-11-19 NOTE — ED Notes (Signed)
Pt given water to drink per request. Pt sitting up in bed eating drinking, tolerating well. NAD noted.

## 2015-11-19 NOTE — ED Notes (Signed)
Patient transported to X-ray 

## 2015-11-19 NOTE — ED Provider Notes (Signed)
[facility] Emergency Department Provider Note  ____________________________________________    I have reviewed the triage vital signs and the nursing notes.   HISTORY  Chief Complaint Shortness of breath  HPI Autumn Moore is a 75 y.o. female who presents with shortness of breath. Patient reports one week ago she developed upper respiratory symptoms such as runny nose, congestion and her breathing is totally gotten worse since then. She blames this on her COPD. Typically her COPD is well controlled. She has never been admitted for COPD. She is on home oxygen. She reports low-grade temperatures. She denies chest pain. No recent travel. No calf pain or swelling.     Past Medical History  Diagnosis Date  . COPD (chronic obstructive pulmonary disease) ([place])   . Hypertension     There are no active problems to display for this patient.   Past Surgical History  Procedure Laterality Date  . Colonoscopy with propofol N/A 03/07/2015    Procedure: COLONOSCOPY WITH PROPOFOL;  Surgeon: Hulen Luster, MD;  Location: [facility] ENDOSCOPY;  Service: Gastroenterology;  Laterality: N/A;  . Esophagogastroduodenoscopy N/A 03/07/2015    Procedure: ESOPHAGOGASTRODUODENOSCOPY (EGD);  Surgeon: Hulen Luster, MD;  Location: [facility] ENDOSCOPY;  Service: Gastroenterology;  Laterality: N/A;    Current Outpatient Rx  Name  Route  Sig  Dispense  Refill  . albuterol (PROVENTIL HFA;VENTOLIN HFA) 108 (90 BASE) MCG/ACT inhaler   Inhalation   Inhale 2 puffs into the lungs every 6 (six) hours as needed for wheezing or shortness of breath.         Marland Kitchen amLODipine (NORVASC) 2.5 MG tablet   Oral   Take 2.5 mg by mouth daily.         . Ascorbic Acid (VITAMIN C) 500 MG CAPS   Oral   Take 1 capsule by mouth daily.         Marland Kitchen aspirin 81 MG tablet   Oral   Take 81 mg by mouth every other day.         . budesonide-formoterol (SYMBICORT) 160-4.5 MCG/ACT inhaler   Inhalation   Inhale 2 puffs into  the lungs daily.         . Calcium Carbonate-Vitamin D (CALCIUM-VITAMIN D) 500-200 MG-UNIT per tablet   Oral   Take 1 tablet by mouth daily.         . carvedilol (COREG) 6.25 MG tablet   Oral   Take 6.25 mg by mouth 2 (two) times daily with a meal.         . Multiple Vitamin (MULTIVITAMIN WITH MINERALS) TABS tablet   Oral   Take 1 tablet by mouth daily.         Marland Kitchen omeprazole (PRILOSEC) 20 MG capsule   Oral   Take 20 mg by mouth every other day.         . OXYGEN   Inhalation   Inhale into the lungs at bedtime.         . Pyridoxine HCl (B-6 PO)   Oral   Take 1 tablet by mouth daily.         . vitamin B-12 (CYANOCOBALAMIN) 50 MCG tablet   Oral   Take 50 mcg by mouth daily.         . vitamin E 400 UNIT capsule   Oral   Take 400 Units by mouth daily.           Allergies Spiriva handihaler  History reviewed. No pertinent family history.  Social History Social History  Substance Use Topics  . Smoking status: Never Smoker   . Smokeless tobacco: None  . Alcohol Use: No    Review of Systems  Constitutional: Low-grade temperatures Eyes: Negative for visual changes. ENT: Negative for sore throat Cardiovascular: Negative for chest pain. Respiratory: As above Gastrointestinal: Negative for abdominal pain, vomiting and diarrhea. Genitourinary: Negative for dysuria. Musculoskeletal: Negative for back pain. Skin: Negative for rash. Neurological: Negative for headaches  Psychiatric: No anxiety    ____________________________________________   PHYSICAL EXAM:  VITAL SIGNS: ED Triage Vitals  Enc Vitals Group     BP 11/19/15 1642 141/63 mmHg     Pulse --      Resp 11/19/15 1627 22     Temp 11/19/15 1627 98.4 F (36.9 C)     Temp Source 11/19/15 1627 Oral     SpO2 11/19/15 1627 90 %     Weight 11/19/15 1627 140 lb (63.504 kg)     Height 11/19/15 1627 5\' 4"  (1.626 m)     Head Cir --      Peak Flow --      Pain Score --      Pain Loc --       Pain Edu? --      Excl. in [place]? --      Constitutional: Alert and oriented. Ill-appearing Eyes: Conjunctivae are normal.  ENT   Head: Normocephalic and atraumatic.   Mouth/Throat: Mucous membranes are moist. Cardiovascular: Normal rate, regular rhythm. Normal and symmetric distal pulses are present in all extremities.  Respiratory: Increased work of breathing, tachypnea. Wheezing bilaterally Gastrointestinal: Soft and non-tender in all quadrants. No distention. There is no CVA tenderness. Genitourinary: deferred Musculoskeletal: Nontender with normal range of motion in all extremities. No lower extremity tenderness nor edema. Neurologic:  Normal speech and language. No gross focal neurologic deficits are appreciated. Skin:  Skin is warm, dry and intact. No rash noted. Psychiatric: Mood and affect are normal. Patient exhibits appropriate insight and judgment.  ____________________________________________    LABS (pertinent positives/negatives)  Labs Reviewed  CBC - Abnormal; Notable for the following:    Platelets 149 (*)    All other components within normal limits  RAPID INFLUENZA A&B ANTIGENS ([facility] ONLY)  BASIC METABOLIC PANEL  TROPONIN I    ____________________________________________   EKG  ED ECG REPORT I, Lavonia Drafts, the attending physician, personally viewed and interpreted this ECG.  Date: 11/19/2015 EKG Time: 4:54 PM Rate: 62 Rhythm: normal sinus rhythm QRS Axis: normal Intervals: normal ST/T Wave abnormalities: normal Conduction Disturbances: none Narrative Interpretation: unremarkable   ____________________________________________    RADIOLOGY I have personally reviewed any xrays that were ordered on this patient: Chest x-ray concerning for atypical infection  ____________________________________________   PROCEDURES  Procedure(s) performed: none  Critical Care performed:  none  ____________________________________________   INITIAL IMPRESSION / ASSESSMENT AND PLAN / ED COURSE  Pertinent labs & imaging results that were available during my care of the patient were reviewed by me and considered in my medical decision making (see chart for details).  Patient presents with complaints of shortness of breath. This is likely related to COPD exacerbated by upper respiratory infection. We will give Solu-Medrol, nebulizers, check chest x-ray and reevaluate  Patient with mild improvement after nebs and slight Medrol. Chest x-ray is concerning for atypical infection. I will draw blood cultures and give Rocephin and Zithromax. She continued to wheeze and required increased oxygen, hence I will admit her to medicine for  further workup  ____________________________________________   FINAL CLINICAL IMPRESSION(S) / ED DIAGNOSES  Final diagnoses:  COPD with exacerbation ([place])  Acute on chronic respiratory failure with hypoxia ([place])  Atypical pneumonia     Lavonia Drafts, MD 11/19/15 [phone]

## 2015-11-19 NOTE — ED Notes (Addendum)
No albuterol in pyxis at this time, pharmacy called, will send to ED. Will start azithromycin once rocephin completes.

## 2015-11-19 NOTE — H&P (Signed)
History and Physical    Autumn Moore [id number] DOB: [birth year] DOA: 11/19/2015  Referring physician: Dr. Corky Downs PCP: Maryland Pink, MD  Specialists: Dr. Raul Del  Chief Complaint: SOB and cough  HPI: Autumn Moore is a 75 y.o. female has a past medical history significant for O2 dependant COPD with chronic respiratory failure now with 2 day hx of worsening SOB and cough with diarrhea. Presented to ER where oxygen requirements were increase. She was given IV steroids and SVN's with little to no improvement. CXR reveals atypical PNA. She is now admitted. Continues to c/o SOB. Denies CP. No fever. Cough has been productive of "orange" sputum.  Review of Systems: The patient denies anorexia, fever, weight loss,, vision loss, decreased hearing, hoarseness, chest pain, syncope, peripheral edema, balance deficits, hemoptysis, abdominal pain, melena, hematochezia, severe indigestion/heartburn, hematuria, incontinence, genital sores, muscle weakness, suspicious skin lesions, transient blindness, difficulty walking, depression, unusual weight change, abnormal bleeding, enlarged lymph nodes, angioedema, and breast masses.   Past Medical History  Diagnosis Date  . COPD (chronic obstructive pulmonary disease) ([place])   . Hypertension    Past Surgical History  Procedure Laterality Date  . Colonoscopy with propofol N/A 03/07/2015    Procedure: COLONOSCOPY WITH PROPOFOL;  Surgeon: Hulen Luster, MD;  Location: [facility] ENDOSCOPY;  Service: Gastroenterology;  Laterality: N/A;  . Esophagogastroduodenoscopy N/A 03/07/2015    Procedure: ESOPHAGOGASTRODUODENOSCOPY (EGD);  Surgeon: Hulen Luster, MD;  Location: [facility] ENDOSCOPY;  Service: Gastroenterology;  Laterality: N/A;   Social History:  reports that she has quit smoking. She does not have any smokeless tobacco history on file. She reports that she does not drink alcohol. Her drug history is not on file.  No Active Allergies  Family History  Problem  Relation Age of Onset  . Hypertension Mother   . Coronary artery disease Father     Prior to Admission medications   Medication Sig Start Date End Date Taking? Authorizing Provider  albuterol (PROVENTIL HFA;VENTOLIN HFA) 108 (90 BASE) MCG/ACT inhaler Inhale 2 puffs into the lungs every 6 (six) hours as needed for wheezing or shortness of breath.    Historical Provider, MD  amLODipine (NORVASC) 2.5 MG tablet Take 2.5 mg by mouth daily.    Historical Provider, MD  Ascorbic Acid (VITAMIN C) 500 MG CAPS Take 1 capsule by mouth daily.    Historical Provider, MD  aspirin 81 MG tablet Take 81 mg by mouth every other day.    Historical Provider, MD  budesonide-formoterol (SYMBICORT) 160-4.5 MCG/ACT inhaler Inhale 2 puffs into the lungs daily.    Historical Provider, MD  Calcium Carbonate-Vitamin D (CALCIUM-VITAMIN D) 500-200 MG-UNIT per tablet Take 1 tablet by mouth daily.    Historical Provider, MD  carvedilol (COREG) 6.25 MG tablet Take 6.25 mg by mouth 2 (two) times daily with a meal.    Historical Provider, MD  Multiple Vitamin (MULTIVITAMIN WITH MINERALS) TABS tablet Take 1 tablet by mouth daily.    Historical Provider, MD  omeprazole (PRILOSEC) 20 MG capsule Take 20 mg by mouth every other day.    Historical Provider, MD  OXYGEN Inhale into the lungs at bedtime.    Historical Provider, MD  Pyridoxine HCl (B-6 PO) Take 1 tablet by mouth daily.    Historical Provider, MD  vitamin B-12 (CYANOCOBALAMIN) 50 MCG tablet Take 50 mcg by mouth daily.    Historical Provider, MD  vitamin E 400 UNIT capsule Take 400 Units by mouth daily.    Historical Provider, MD  Physical Exam: Filed Vitals:   11/19/15 1627 11/19/15 1642 11/19/15 1800 11/19/15 1918  BP:  141/63 128/64 121/45  Pulse:   57 63  Temp: 98.4 F (36.9 C)     TempSrc: Oral     Resp: 22  15 17   Height: 5\' 4"  (1.626 m)     Weight: 63.504 kg (140 lb)     SpO2: 90%  98% 98%     General:  Acutely ill appearing in moderate respiratory  distress, [place]/AT  Eyes: PERRL, EOMI, no scleral icterus, conjunctiva clear  ENT: dry oropharynx without exudate  Neck: supple, no lymphadenopathy. No JVD or thyromegaly  Cardiovascular: regular rate without MRG; 2+ peripheral pulses, no JVD, no peripheral edema  Respiratory: respiratory effort increased. Decreased breath sounds throughout with diffuse wheezes and rhonchi. No dullness or rales.  Abdomen: soft, non tender to palpation, positive bowel sounds, no guarding, no rebound  Skin: no rashes or lesions  Musculoskeletal: normal bulk and tone, no joint swelling  Psychiatric: normal mood and affect, A&OX3  Neurologic: CN 2-12 grossly intact, Motor strength 5/5 in all 4 groups with normal sensory exam and symmetric DTR's  Labs on Admission:  Basic Metabolic Panel:  Recent Labs Lab 11/19/15 1635  NA 143  K 4.2  CL 99*  CO2 41*  GLUCOSE 127*  BUN 24*  CREATININE 0.64  CALCIUM 8.9   Liver Function Tests: No results for input(s): AST, ALT, ALKPHOS, BILITOT, PROT, ALBUMIN in the last 168 hours. No results for input(s): LIPASE, AMYLASE in the last 168 hours. No results for input(s): AMMONIA in the last 168 hours. CBC:  Recent Labs Lab 11/19/15 1635  WBC 5.9  HGB 12.0  HCT 37.2  MCV 90.7  PLT 149*   Cardiac Enzymes:  Recent Labs Lab 11/19/15 1635  TROPONINI <0.03    BNP (last 3 results) No results for input(s): BNP in the last 8760 hours.  ProBNP (last 3 results) No results for input(s): PROBNP in the last 8760 hours.  CBG: No results for input(s): GLUCAP in the last 168 hours.  Radiological Exams on Admission: Dg Chest 2 View  11/19/2015  CLINICAL DATA:  Cough, chest congestion, weakness. EXAM: CHEST  2 VIEW COMPARISON:  Chest CT dated 01/06/2014. FINDINGS: Normal sized heart. Patchy interstitial prominence in both lungs, most pronounced in the lower lung zones, with some ill-defined nodularity on the left. There was more nodularity visible on the  previous CT. The hemidiaphragms are flattened. Diffuse osteopenia. IMPRESSION: 1. Progressive coarse, patchy interstitial prominence in both lungs with continued nodularity on the left. This remains suspicious for atypical infection. This could be better defined with a repeat chest CT with contrast. 2. COPD. Electronically Signed   By: Claudie Revering M.D.   On: 11/19/2015 17:32    EKG: Independently reviewed.  Assessment/Plan Principal Problem:   Acute respiratory failure (HCC) Active Problems:   CAP (community acquired pneumonia)   COPD exacerbation ([place])   Diarrhea   Will admit to floor with O2, IV steroids, and IV ABX. Cultures sent. Consult Pulmonology. Repeat labs and CXR in AM.  Diet: low sodium Fluids: 1/2 NS@100  DVT Prophylaxis: SQ Heparin  Code Status: FULL  Family Communication: yes   Disposition Plan: home  Time spent: 55 min

## 2015-11-19 NOTE — ED Notes (Signed)
Pt given warm blankets, lights dimmed, TV on. Pt resting comfortably at this time, NAD noted. Pt verbalized no further needs at this time.

## 2015-11-19 NOTE — ED Notes (Signed)
MD Kinner at bedside  

## 2015-11-20 ENCOUNTER — Encounter: Payer: Self-pay | Admitting: *Deleted

## 2015-11-20 ENCOUNTER — Inpatient Hospital Stay: Payer: Medicare Other

## 2015-11-20 DIAGNOSIS — J9601 Acute respiratory failure with hypoxia: Secondary | ICD-10-CM

## 2015-11-20 DIAGNOSIS — J441 Chronic obstructive pulmonary disease with (acute) exacerbation: Secondary | ICD-10-CM

## 2015-11-20 LAB — CBC
HCT: 36.3 % (ref 35.0–47.0)
Hemoglobin: 11.8 g/dL — ABNORMAL LOW (ref 12.0–16.0)
MCH: 30.3 pg (ref 26.0–34.0)
MCHC: 32.4 g/dL (ref 32.0–36.0)
MCV: 93.3 fL (ref 80.0–100.0)
PLATELETS: 145 10*3/uL — AB (ref 150–440)
RBC: 3.9 MIL/uL (ref 3.80–5.20)
RDW: 14.1 % (ref 11.5–14.5)
WBC: 3.8 10*3/uL (ref 3.6–11.0)

## 2015-11-20 LAB — COMPREHENSIVE METABOLIC PANEL
ALK PHOS: 53 U/L (ref 38–126)
ALT: 55 U/L — AB (ref 14–54)
AST: 52 U/L — ABNORMAL HIGH (ref 15–41)
Albumin: 3.2 g/dL — ABNORMAL LOW (ref 3.5–5.0)
Anion gap: 7 (ref 5–15)
BUN: 21 mg/dL — AB (ref 6–20)
CALCIUM: 8.5 mg/dL — AB (ref 8.9–10.3)
CO2: 37 mmol/L — AB (ref 22–32)
CREATININE: 0.53 mg/dL (ref 0.44–1.00)
Chloride: 100 mmol/L — ABNORMAL LOW (ref 101–111)
GFR calc non Af Amer: >60 mL/min (ref >60)
GLUCOSE: 169 mg/dL — AB (ref 65–99)
Potassium: 4 mmol/L (ref 3.5–5.1)
SODIUM: 144 mmol/L (ref 135–145)
Total Bilirubin: 0.4 mg/dL (ref 0.3–1.2)
Total Protein: 6.4 g/dL — ABNORMAL LOW (ref 6.5–8.1)

## 2015-11-20 LAB — GLUCOSE, CAPILLARY
GLUCOSE-CAPILLARY: 165 mg/dL — AB (ref 65–99)
Glucose-Capillary: 212 mg/dL — ABNORMAL HIGH (ref 65–99)
Glucose-Capillary: 206 mg/dL — ABNORMAL HIGH (ref 65–99)
Glucose-Capillary: 178 mg/dL — ABNORMAL HIGH (ref 65–99)

## 2015-11-20 MED ORDER — NYSTATIN 100000 UNIT/ML MT SUSP
5.0000 mL | Freq: Four times a day (QID) | OROMUCOSAL | Status: DC
  Administered 2015-11-20 – 2015-11-23 (×12): 500000 [IU] via OROMUCOSAL
  Filled 2015-11-20 (×12): qty 5

## 2015-11-20 NOTE — Consult Note (Signed)
[facility] Consultation      Assessment and Plan:  Acute exacerbation of COPD, with acute bronchitis, bleeding. Acute respiratory failure. -Continue treatment with IV steroids, azithromycin. Can possibly change to an oral steroid taper tomorrow.  Chronic respiratory failure with severe COPD, on home oxygen at 3 L. -Patient admits to not taking inhalers regularly due to their cost. I have encouraged her to call the numbers to see if she may qualify for patient assistance, as being on these medications rectally can reduce her risk for exacerbations. We'll consult social work to see if they can help in this regard.  Follow-up with Dr. Raul Del upon discharge.    Date: 11/20/2015  MRN# [id number] Autumn Moore Sep 09, 1941  Referring Physician: Dr. Doy Hutching.   Autumn Moore is a 75 y.o. old female seen in consultation for chief complaint of:    Chief Complaint  Patient presents with  . Shortness of Breath  . Influenza    HPI:   Patient is a 75yo female with history of very severe COPD, for which she normally sees Dr. Raul Del. She is maintained on spiriva, symbicort, and 3L O2.   Review of CXR 2/18 images showed hyperinflation bronchitic changes consistent with severe emphysema.   She was initially found to have an O2 sat of 75% on EMS arrival on home O2, she was given duoneb, her sat was 99% at RA at arrival in ER. She has been afebrile since admission, without leukocytosis. Her CO2 on chem panel was elevated at 41. She has been started on solumedrol at 60 mg q6,azithro.   Currently, she tells me that her breathing has significantly improved since the time of admission. She told him that the breathing had been getting progressively worse over the last 3-4 days. She has been prescribed Symbicort and Spiriva, but admits that she does not take them regularly due to their cost. She notes that she takes Symbicort 1 puff once daily on one week and then will alternate  the following week with Spiriva approximately once daily, but some days not at all. I have asked her why she does not try to call the company's for financial support, she says she does not like the mass with the telephone. Also, she notes that if she has to have a long conversation telephone. She will become more short of breath. I asked her to consider trying to call the numbers because if she takes the medications more regularly  she will not become so short of breath while talking on the telephone.  PMHX:   Past Medical History  Diagnosis Date  . COPD (chronic obstructive pulmonary disease) ([place])   . Hypertension   . CAD (coronary artery disease)    Surgical Hx:  Past Surgical History  Procedure Laterality Date  . Colonoscopy with propofol N/A 03/07/2015    Procedure: COLONOSCOPY WITH PROPOFOL;  Surgeon: Hulen Luster, MD;  Location: [facility] ENDOSCOPY;  Service: Gastroenterology;  Laterality: N/A;  . Esophagogastroduodenoscopy N/A 03/07/2015    Procedure: ESOPHAGOGASTRODUODENOSCOPY (EGD);  Surgeon: Hulen Luster, MD;  [facility]COPY;  Service: Gastroenterology;  Laterality: N/A;   Family Hx:  Family History  Problem Relation Age of Onset  . Hypertension Mother   . Coronary artery disease Father    Social Hx:   Social History  Substance Use Topics  . Smoking status: Former Research scientist (life sciences)  . Smokeless tobacco: None  . Alcohol Use: No   Medication:   No current outpatient prescriptions on file.  Allergies:  Review of patient's allergies indicates no known allergies.  Review of Systems: Gen:  Denies  fever, sweats, chills HEENT: Denies blurred vision, double vision. bleeds, sore throat Cvc:  No dizziness, chest pain. Resp:   Denies hemoptysis. Gi: Denies swallowing difficulty, stomach pain. Gu:  Denies bladder incontinence, burning urine Ext:   No Joint pain, stiffness. Skin: No skin rash,  hives Endoc:  No polyuria, polydipsia. Psych: No depression, insomnia. Other:  All other  systems were reviewed with the patient and were negative other that what is mentioned in the HPI.   Physical Examination:   VS: BP 146/68 mmHg  Pulse 72  Temp(Src) 97.7 F (36.5 C) (Oral)  Resp 16  Ht 5\' 1"  (1.549 m)  Wt 138 lb (62.596 kg)  BMI 26.09 kg/m2  SpO2 92%  General Appearance: No distress  Neuro:without focal findings,  speech normal,  HEENT: PERRLA, EOM intact.   Pulmonary: Diffuse bilateral expiratory wheezing. CardiovascularNormal S1,S2.  No m/r/g.   Abdomen: Benign, Soft, non-tender. Renal:  No costovertebral tenderness  GU:  No performed at this time. Endoc: No evident thyromegaly, no signs of acromegaly. Skin:   warm, no rashes, no ecchymosis  Extremities: normal, no cyanosis, clubbing.  Other findings:    LABORATORY PANEL:   CBC  Recent Labs Lab 11/20/15 0432  WBC 3.8  HGB 11.8*  HCT 36.3  PLT 145*   ------------------------------------------------------------------------------------------------------------------  Chemistries   Recent Labs Lab 11/20/15 0432  NA 144  K 4.0  CL 100*  CO2 37*  GLUCOSE 169*  BUN 21*  CREATININE 0.53  CALCIUM 8.5*  AST 52*  ALT 55*  ALKPHOS 53  BILITOT 0.4   ------------------------------------------------------------------------------------------------------------------  Cardiac Enzymes  Recent Labs Lab 11/19/15 1635  TROPONINI <0.03   ------------------------------------------------------------  RADIOLOGY:  Dg Chest 2 View  11/19/2015  CLINICAL DATA:  Cough, chest congestion, weakness. EXAM: CHEST  2 VIEW COMPARISON:  Chest CT dated 01/06/2014. FINDINGS: Normal sized heart. Patchy interstitial prominence in both lungs, most pronounced in the lower lung zones, with some ill-defined nodularity on the left. There was more nodularity visible on the previous CT. The hemidiaphragms are flattened. Diffuse osteopenia. IMPRESSION: 1. Progressive coarse, patchy interstitial prominence in both lungs with  continued nodularity on the left. This remains suspicious for atypical infection. This could be better defined with a repeat chest CT with contrast. 2. COPD. Electronically Signed   By: Claudie Revering M.D.   On: 11/19/2015 17:32       Thank  you for the consultation and for allowing [facility] to assist in the care of your patient. Our recommendations are noted above.  Please contact us if we can be of further service.   Marda Stalker, MD.  Board Certified in Internal Medicine, Pulmonary Medicine, [place], and Sleep Medicine.  [facility]   Patricia Pesa, M.D.  Vilinda Boehringer, M.D.  Merton Border, M.D

## 2015-11-20 NOTE — Progress Notes (Signed)
Flutter valve given to patient with instructions.

## 2015-11-20 NOTE — Progress Notes (Signed)
Patient c/o some sob. RN reports had to increase o2 to 4 liters due to some desaturation. PRN treatment administered at this time. Patient's vitals are stable. BBS noted to be diminished. No wheezing noted. Patient has loose non productive cough. Will continue evaluate. Patient states she is feeling much better post treatment

## 2015-11-20 NOTE — Care Management Important Message (Signed)
Important Message  Patient Details  Name: Autumn Moore MRN: [id number] Date of Birth: 19-May-1941   Medicare Important Message Given:  Yes    Elvin Mccartin A, RN 11/20/2015, 2:37 PM

## 2015-11-21 DIAGNOSIS — J440 Chronic obstructive pulmonary disease with acute lower respiratory infection: Secondary | ICD-10-CM | POA: Diagnosis not present

## 2015-11-21 LAB — GLUCOSE, CAPILLARY
GLUCOSE-CAPILLARY: 142 mg/dL — AB (ref 65–99)
GLUCOSE-CAPILLARY: 151 mg/dL — AB (ref 65–99)
Glucose-Capillary: 186 mg/dL — ABNORMAL HIGH (ref 65–99)
Glucose-Capillary: 189 mg/dL — ABNORMAL HIGH (ref 65–99)

## 2015-11-21 MED ORDER — ENOXAPARIN SODIUM 40 MG/0.4ML ~~LOC~~ SOLN
40.0000 mg | SUBCUTANEOUS | Status: DC
  Administered 2015-11-21 – 2015-11-22 (×2): 40 mg via SUBCUTANEOUS
  Filled 2015-11-21 (×2): qty 0.4

## 2015-11-21 MED ORDER — PREDNISONE 50 MG PO TABS
50.0000 mg | ORAL_TABLET | Freq: Every day | ORAL | Status: DC
  Administered 2015-11-21 – 2015-11-23 (×3): 50 mg via ORAL
  Filled 2015-11-21 (×3): qty 1

## 2015-11-21 MED ORDER — FAMOTIDINE 20 MG PO TABS: 20.0000 mg | ORAL_TABLET | Freq: Two times a day (BID) | ORAL | Status: DC | PRN

## 2015-11-21 MED ORDER — BACITRACIN 500 UNIT/GM EX OINT
1.0000 "application " | TOPICAL_OINTMENT | Freq: Two times a day (BID) | CUTANEOUS | Status: DC
  Administered 2015-11-21 – 2015-11-23 (×4): 1 via TOPICAL
  Filled 2015-11-21 (×8): qty 0.9

## 2015-11-21 MED ORDER — PREDNISONE 50 MG PO TABS: 50.0000 mg | ORAL_TABLET | Freq: Every day | ORAL | Status: DC

## 2015-11-21 MED ORDER — AZITHROMYCIN 250 MG PO TABS
500.0000 mg | ORAL_TABLET | Freq: Every day | ORAL | Status: DC
  Administered 2015-11-22 – 2015-11-23 (×2): 500 mg via ORAL
  Filled 2015-11-21 (×2): qty 2

## 2015-11-21 NOTE — Progress Notes (Signed)
MD gave order for bacitracin to be applied to her left middle finger.  continious pulse ox discontinued

## 2015-11-21 NOTE — Progress Notes (Signed)
[facility] [place] NAME: Autumn Moore    MR#:  [id number]  DATE OF BIRTH:  Mar 12, 1941  SUBJECTIVE:  CHIEF COMPLAINT:   Chief Complaint  Patient presents with  . Shortness of Breath  . Influenza    came with severe coughing, shortness of breath.  Feels slightly better but still have severe coughing.  REVIEW OF SYSTEMS:  CONSTITUTIONAL: No fever, fatigue or weakness.  EYES: No blurred or double vision.  EARS, NOSE, AND THROAT: No tinnitus or ear pain.  RESPIRATORY: positive for cough & shortness of breath, no wheezing or hemoptysis.  CARDIOVASCULAR: No chest pain, orthopnea, edema.  GASTROINTESTINAL: No nausea, vomiting, diarrhea or abdominal pain.  GENITOURINARY: No dysuria, hematuria.  ENDOCRINE: No polyuria, nocturia,  HEMATOLOGY: No anemia, easy bruising or bleeding SKIN: No rash or lesion. MUSCULOSKELETAL: No joint pain or arthritis.   NEUROLOGIC: No tingling, numbness, weakness.  PSYCHIATRY: No anxiety or depression.   ROS  DRUG ALLERGIES:  No Known Allergies  VITALS:  Blood pressure 138/55, pulse 62, temperature 97.9 F (36.6 C), temperature source Oral, resp. rate 18, height 5\' 1"  (1.549 m), weight 62.596 kg (138 lb), SpO2 96 %.  PHYSICAL EXAMINATION:  GENERAL:  75 y.o.-year-old patient lying in the bed with no acute distress.  EYES: Pupils equal, round, reactive to light and accommodation. No scleral icterus. Extraocular muscles intact.  HEENT: Head atraumatic, normocephalic. Oropharynx and nasopharynx clear.  NECK:  Supple, no jugular venous distention. No thyroid enlargement, no tenderness.  LUNGS: Normal breath sounds bilaterally, mild wheezing, no crepitation. No use of accessory muscles of respiration.    Using supplemental oxygen. CARDIOVASCULAR: S1, S2 normal. No murmurs, rubs, or gallops.  ABDOMEN: Soft, nontender, nondistended. Bowel sounds present. No organomegaly or mass.  EXTREMITIES: No pedal  edema, cyanosis, or clubbing.  NEUROLOGIC: Cranial nerves II through XII are intact. Muscle strength 5/5 in all extremities. Sensation intact. Gait not checked.  PSYCHIATRIC: The patient is alert and oriented x 3.  SKIN: No obvious rash, lesion, or ulcer.   Physical Exam LABORATORY PANEL:   CBC  Recent Labs Lab 11/20/15 0432  WBC 3.8  HGB 11.8*  HCT 36.3  PLT 145*   ------------------------------------------------------------------------------------------------------------------  Chemistries   Recent Labs Lab 11/20/15 0432  NA 144  K 4.0  CL 100*  CO2 37*  GLUCOSE 169*  BUN 21*  CREATININE 0.53  CALCIUM 8.5*  AST 52*  ALT 55*  ALKPHOS 53  BILITOT 0.4   ------------------------------------------------------------------------------------------------------------------  Cardiac Enzymes  Recent Labs Lab 11/19/15 1635  TROPONINI <0.03   ------------------------------------------------------------------------------------------------------------------  RADIOLOGY:  X-ray Chest Pa And Lateral  11/20/2015  CLINICAL DATA:  Sob and cough; admitted yesterday; hx htn, copd, and cough; former smoker EXAM: CHEST  2 VIEW COMPARISON:  11/19/2015 FINDINGS: Irregular interstitial thickening, with some nodularity, noted on the prior study is without significant change. There is a small focus of opacity just below the minor fissure on the right which is new, likely atelectasis. No other change in the appearance of the lungs. Heart and mediastinum are unremarkable. Prominence of the pulmonary arteries is stable. No pleural effusion or pneumothorax.  Lungs are hyperexpanded. IMPRESSION: 1. Small focus of opacity has developed just below the minor fissure on the right. This is likely atelectasis. Small area of pneumonia is possible. 2. Irregular interstitial thickening with associated nodularity is without change from the previous day's study. Electronically Signed   By: Dedra Skeens.D.  On: 11/20/2015 11:41   Dg Chest 2 View  11/19/2015  CLINICAL DATA:  Cough, chest congestion, weakness. EXAM: CHEST  2 VIEW COMPARISON:  Chest CT dated 01/06/2014. FINDINGS: Normal sized heart. Patchy interstitial prominence in both lungs, most pronounced in the lower lung zones, with some ill-defined nodularity on the left. There was more nodularity visible on the previous CT. The hemidiaphragms are flattened. Diffuse osteopenia. IMPRESSION: 1. Progressive coarse, patchy interstitial prominence in both lungs with continued nodularity on the left. This remains suspicious for atypical infection. This could be better defined with a repeat chest CT with contrast. 2. COPD. Electronically Signed   By: Claudie Revering M.D.   On: 11/19/2015 17:32    ASSESSMENT AND PLAN:   Principal Problem:   Acute respiratory failure (HCC) Active Problems:   CAP (community acquired pneumonia)   COPD exacerbation ([place])   Diarrhea  * Acute hypoxic respiratory failure  Due to community-acquired pneumonia and COPD exacerbation    IV steroid, nebulizer therapy, antibiotics.   Continue supplemental oxygen, with poor pulmonary consult.   Encouraged to use flutter valve.   Give cough suppressant.  * Hypertension   Continue amlodipine and carvedilol.    All the records are reviewed and case discussed with Care Management/Social Workerr. Management plans discussed with the patient, family and they are in agreement.  CODE STATUS: Full.  TOTAL TIME TAKING CARE OF THIS PATIENT: 35 minutes.    POSSIBLE D/C IN 1-2 DAYS, DEPENDING ON CLINICAL CONDITION.   Vaughan Basta M.D on 11/21/2015   Between 7am to 6pm - Pager - [phone]  After 6pm go to [email or web address] - password [email or web address] [facility]  Office  [phone]  CC: Primary care physician; [place] Pink, MD  Note: This dictation was prepared with Dragon dictation along with smaller phrase technology. Any transcriptional errors  that result from this process are unintentional.

## 2015-11-21 NOTE — Progress Notes (Signed)
Clinical Social Worker (CSW) received consult for assistance with medications. RN Case Manager aware of above. Please reconsult if future social work needs arise. CSW signing off.   Amulya Quintin Morgan, LCSW [phone] 

## 2015-11-22 LAB — GLUCOSE, CAPILLARY
GLUCOSE-CAPILLARY: 108 mg/dL — AB (ref 65–99)
Glucose-Capillary: 107 mg/dL — ABNORMAL HIGH (ref 65–99)

## 2015-11-22 MED ORDER — CEPHALEXIN 500 MG PO CAPS
500.0000 mg | ORAL_CAPSULE | Freq: Two times a day (BID) | ORAL | Status: DC
  Administered 2015-11-22 – 2015-11-23 (×3): 500 mg via ORAL
  Filled 2015-11-22 (×4): qty 1

## 2015-11-22 NOTE — Care Management Important Message (Signed)
Important Message  Patient Details  Name: Autumn Moore MRN: [id number] Date of Birth: [birth year]   Medicare Important Message Given:  Yes    [name] 11/22/2015, 10:03 AM

## 2015-11-22 NOTE — Progress Notes (Signed)
[facility] NAME: Autumn Moore    MR#:  [id number]  DATE OF BIRTH:  [birth year]  SUBJECTIVE:  CHIEF COMPLAINT:   Chief Complaint  Patient presents with  . Shortness of Breath  . Influenza    came with severe coughing, shortness of breath.  Feels slightly better and less severe coughing.   Gets SOB on exertion.   No fever or sputum.  REVIEW OF SYSTEMS:  CONSTITUTIONAL: No fever, fatigue or weakness.  EYES: No blurred or double vision.  EARS, NOSE, AND THROAT: No tinnitus or ear pain.  RESPIRATORY: positive for cough & shortness of breath, no wheezing or hemoptysis.  CARDIOVASCULAR: No chest pain, orthopnea, edema.  GASTROINTESTINAL: No nausea, vomiting, diarrhea or abdominal pain.  GENITOURINARY: No dysuria, hematuria.  ENDOCRINE: No polyuria, nocturia,  HEMATOLOGY: No anemia, easy bruising or bleeding SKIN: No rash or lesion. MUSCULOSKELETAL: No joint pain or arthritis.   NEUROLOGIC: No tingling, numbness, weakness.  PSYCHIATRY: No anxiety or depression.   ROS  DRUG ALLERGIES:  No Known Allergies  VITALS:  Blood pressure 129/70, pulse 77, temperature 98.1 F (36.7 C), temperature source Oral, resp. rate 18, height 5\' 1"  (1.549 m), weight 64.184 kg (141 lb 8 oz), SpO2 92 %.  PHYSICAL EXAMINATION:  GENERAL:  75 y.o.-year-old patient lying in the bed with no acute distress.  EYES: Pupils equal, round, reactive to light and accommodation. No scleral icterus. Extraocular muscles intact.  HEENT: Head atraumatic, normocephalic. Oropharynx and nasopharynx clear.  NECK:  Supple, no jugular venous distention. No thyroid enlargement, no tenderness.  LUNGS: Normal breath sounds bilaterally, mild wheezing, no crepitation. No use of accessory muscles of respiration.    Using supplemental oxygen. CARDIOVASCULAR: S1, S2 normal. No murmurs, rubs, or gallops.  ABDOMEN: Soft, nontender, nondistended. Bowel sounds present. No  organomegaly or mass.  EXTREMITIES: No pedal edema, cyanosis, or clubbing.  NEUROLOGIC: Cranial nerves II through XII are intact. Muscle strength 5/5 in all extremities. Sensation intact. Gait not checked.  PSYCHIATRIC: The patient is alert and oriented x 3.  SKIN: No obvious rash, lesion, or ulcer.   Physical Exam LABORATORY PANEL:   CBC  Recent Labs Lab 11/20/15 0432  WBC 3.8  HGB 11.8*  HCT 36.3  PLT 145*   ------------------------------------------------------------------------------------------------------------------  Chemistries   Recent Labs Lab 11/20/15 0432  NA 144  K 4.0  CL 100*  CO2 37*  GLUCOSE 169*  BUN 21*  CREATININE 0.53  CALCIUM 8.5*  AST 52*  ALT 55*  ALKPHOS 53  BILITOT 0.4   ------------------------------------------------------------------------------------------------------------------  Cardiac Enzymes  Recent Labs Lab 11/19/15 1635  TROPONINI <0.03   ------------------------------------------------------------------------------------------------------------------  RADIOLOGY:  No results found.  ASSESSMENT AND PLAN:   Principal Problem:   Acute respiratory failure (HCC) Active Problems:   CAP (community acquired pneumonia)   COPD exacerbation ([place])   Diarrhea  * Acute hypoxic respiratory failure  Due to community-acquired pneumonia and COPD exacerbation   She is on home o2 2-3 ltr.    on rocephin and azithromycin.   IV steroid- switched to oral , nebulizer therapy, antibiotics.    Appreciated pulmonary consult.   Encouraged to use flutter valve.   Give cough suppressant.   Pt is seen by Dr. Raul Del today- and as per her- He suggested Keep one more night and follow Xray chest tomorrow.  * Hypertension   Continue amlodipine and carvedilol.  * hyperglycemia- due to steroids.  All the records are reviewed  and case discussed with Care Management/Social Workerr. Management plans discussed with the patient, family and they  are in agreement.  CODE STATUS: Full.  TOTAL TIME TAKING CARE OF THIS PATIENT: 35 minutes.    POSSIBLE D/C IN 1-2 DAYS, DEPENDING ON CLINICAL CONDITION.   Vaughan Basta M.D on 11/22/2015   Between 7am to 6pm - Pager - [phone]  After 6pm go to [email or web address] - password [email or web address] [place] [facility]  Office  [phone]  CC: Primary care physician; [place] Pink, MD  Note: This dictation was prepared with Dragon dictation along with smaller phrase technology. Any transcriptional errors that result from this process are unintentional.

## 2015-11-22 NOTE — Progress Notes (Signed)
[facility] [place] NAME: Autumn Moore    MR#:  [id number]  DATE OF BIRTH:  14-Dec-1940  SUBJECTIVE:  CHIEF COMPLAINT:   Chief Complaint  Patient presents with  . Shortness of Breath  . Influenza    came with severe coughing, shortness of breath.  Feels slightly better and less severe coughing.   Gets SOB on minimal exertion.  REVIEW OF SYSTEMS:  CONSTITUTIONAL: No fever, fatigue or weakness.  EYES: No blurred or double vision.  EARS, NOSE, AND THROAT: No tinnitus or ear pain.  RESPIRATORY: positive for cough & shortness of breath, no wheezing or hemoptysis.  CARDIOVASCULAR: No chest pain, orthopnea, edema.  GASTROINTESTINAL: No nausea, vomiting, diarrhea or abdominal pain.  GENITOURINARY: No dysuria, hematuria.  ENDOCRINE: No polyuria, nocturia,  HEMATOLOGY: No anemia, easy bruising or bleeding SKIN: No rash or lesion. MUSCULOSKELETAL: No joint pain or arthritis.   NEUROLOGIC: No tingling, numbness, weakness.  PSYCHIATRY: No anxiety or depression.   ROS  DRUG ALLERGIES:  No Known Allergies  VITALS:  Blood pressure 135/80, pulse 61, temperature 98 F (36.7 C), temperature source Oral, resp. rate 16, height 5\' 1"  (1.549 m), weight 64.184 kg (141 lb 8 oz), SpO2 91 %.  PHYSICAL EXAMINATION:  GENERAL:  75 y.o.-year-old patient lying in the bed with no acute distress.  EYES: Pupils equal, round, reactive to light and accommodation. No scleral icterus. Extraocular muscles intact.  HEENT: Head atraumatic, normocephalic. Oropharynx and nasopharynx clear.  NECK:  Supple, no jugular venous distention. No thyroid enlargement, no tenderness.  LUNGS: Normal breath sounds bilaterally, mild wheezing, no crepitation. No use of accessory muscles of respiration.    Using supplemental oxygen. CARDIOVASCULAR: S1, S2 normal. No murmurs, rubs, or gallops.  ABDOMEN: Soft, nontender, nondistended. Bowel sounds present. No organomegaly or  mass.  EXTREMITIES: No pedal edema, cyanosis, or clubbing.  NEUROLOGIC: Cranial nerves II through XII are intact. Muscle strength 5/5 in all extremities. Sensation intact. Gait not checked.  PSYCHIATRIC: The patient is alert and oriented x 3.  SKIN: No obvious rash, lesion, or ulcer.   Physical Exam LABORATORY PANEL:   CBC  Recent Labs Lab 11/20/15 0432  WBC 3.8  HGB 11.8*  HCT 36.3  PLT 145*   ------------------------------------------------------------------------------------------------------------------  Chemistries   Recent Labs Lab 11/20/15 0432  NA 144  K 4.0  CL 100*  CO2 37*  GLUCOSE 169*  BUN 21*  CREATININE 0.53  CALCIUM 8.5*  AST 52*  ALT 55*  ALKPHOS 53  BILITOT 0.4   ------------------------------------------------------------------------------------------------------------------  Cardiac Enzymes  Recent Labs Lab 11/19/15 1635  TROPONINI <0.03   ------------------------------------------------------------------------------------------------------------------  RADIOLOGY:  X-ray Chest Pa And Lateral  11/20/2015  CLINICAL DATA:  Sob and cough; admitted yesterday; hx htn, copd, and cough; former smoker EXAM: CHEST  2 VIEW COMPARISON:  11/19/2015 FINDINGS: Irregular interstitial thickening, with some nodularity, noted on the prior study is without significant change. There is a small focus of opacity just below the minor fissure on the right which is new, likely atelectasis. No other change in the appearance of the lungs. Heart and mediastinum are unremarkable. Prominence of the pulmonary arteries is stable. No pleural effusion or pneumothorax.  Lungs are hyperexpanded. IMPRESSION: 1. Small focus of opacity has developed just below the minor fissure on the right. This is likely atelectasis. Small area of pneumonia is possible. 2. Irregular interstitial thickening with associated nodularity is without change from the previous day's study. Electronically  Signed  By: Lajean Manes M.D.   On: 11/20/2015 11:41    ASSESSMENT AND PLAN:   Principal Problem:   Acute respiratory failure (HCC) Active Problems:   CAP (community acquired pneumonia)   COPD exacerbation ([place])   Diarrhea  * Acute hypoxic respiratory failure  Due to community-acquired pneumonia and COPD exacerbation   COnt supplemental oxygen.   She is on home o2.   She is on rocephin and azithromycin.   IV steroid, nebulizer therapy, antibiotics.   Continue supplemental oxygen, with poor pulmonary consult.   Encouraged to use flutter valve.   Give cough suppressant.  * Hypertension   Continue amlodipine and carvedilol.    All the records are reviewed and case discussed with Care Management/Social Workerr. Management plans discussed with the patient, family and they are in agreement.  CODE STATUS: Full.  TOTAL TIME TAKING CARE OF THIS PATIENT: 35 minutes.    POSSIBLE D/C IN 1-2 DAYS, DEPENDING ON CLINICAL CONDITION.   Vaughan Basta M.D on 11/22/2015   Between 7am to 6pm - Pager - [phone]  After 6pm go to [email or web address] - password [email or web address] [facility] [facility]  Office  [phone]  CC: Primary care physician; [place] Pink, MD  Note: This dictation was prepared with Dragon dictation along with smaller phrase technology. Any transcriptional errors that result from this process are unintentional.

## 2015-11-22 NOTE — Care Management Note (Signed)
Case Management Note  Patient Details  Name: Autumn Moore MRN: [id number] Date of Birth: [birth year]  Subjective/Objective:    75yo Autumn Autumn Moore has [facility] insurance but is requesting assistance with co-pays for Symbicort and Proventil HFA . Coupons obtained from [email or web address] and given to Autumn Moore. Suggested that Autumn Moore discuss her issues about inability to afford her co-pays co-pays with her PCP and request less expensive inhalers.             Action/Plan:   Expected Discharge Date:                  Expected Discharge Plan:     In-House Referral:     Discharge planning Services     Post Acute Care Choice:    Choice offered to:     DME Arranged:    DME Agency:     HH Arranged:    [place] Agency:     Status of Service:     Medicare Important Message Given:  Yes Date Medicare IM Given:    Medicare IM give by:    Date Additional Medicare IM Given:    Additional Medicare Important Message give by:     If discussed at [place] of Stay Meetings, dates discussed:    Additional Comments:  Curt Oatis A, RN 11/22/2015, 1:57 PM

## 2015-11-23 ENCOUNTER — Inpatient Hospital Stay: Payer: Medicare Other

## 2015-11-23 LAB — GLUCOSE, CAPILLARY: GLUCOSE-CAPILLARY: 111 mg/dL — AB (ref 65–99)

## 2015-11-23 MED ORDER — AZITHROMYCIN 250 MG PO TABS: ORAL_TABLET | ORAL | Status: AC

## 2015-11-23 MED ORDER — CEPHALEXIN 500 MG PO CAPS: 500.0000 mg | ORAL_CAPSULE | Freq: Two times a day (BID) | ORAL | Status: AC

## 2015-11-23 MED ORDER — ASPIRIN 81 MG PO TABS: 81.0000 mg | ORAL_TABLET | ORAL | Status: AC

## 2015-11-23 MED ORDER — PREDNISONE 10 MG PO TABS: ORAL_TABLET | ORAL | Status: AC

## 2015-11-23 MED ORDER — IPRATROPIUM-ALBUTEROL 0.5-2.5 (3) MG/3ML IN SOLN
3.0000 mL | Freq: Three times a day (TID) | RESPIRATORY_TRACT | Status: DC
  Filled 2015-11-23: qty 3

## 2015-11-23 NOTE — Progress Notes (Signed)
Date: 11/23/2015,   MRN# [id number] Autumn SARASIN 10/05/1940 Code Status:     Code Status Orders        Start     Ordered   11/19/15 2116  Full code   Continuous     11/19/15 2116    Code Status History    Date Active Date Inactive Code Status Order ID Comments User Context   This patient has a current code status but no historical code status.      HPI: seen yesterday, improved. No new complaints, less sob, ready to go home   PMHX:   Past Medical History  Diagnosis Date  . COPD (chronic obstructive pulmonary disease) ([place])   . Hypertension   . CAD (coronary artery disease)    Surgical Hx:  Past Surgical History  Procedure Laterality Date  . Colonoscopy with propofol N/A 03/07/2015    Procedure: COLONOSCOPY WITH PROPOFOL;  Surgeon: Hulen Luster, MD;  Location: [facility] ENDOSCOPY;  Service: Gastroenterology;  Laterality: N/A;  . Esophagogastroduodenoscopy N/A 03/07/2015    Procedure: ESOPHAGOGASTRODUODENOSCOPY (EGD);  Surgeon: Hulen Luster, MD;  Location: [facility] ENDOSCOPY;  Service: Gastroenterology;  Laterality: N/A;   Family Hx:  Family History  Problem Relation Age of Onset  . Hypertension Mother   . Coronary artery disease Father    Social Hx:   Social History  Substance Use Topics  . Smoking status: Former Research scientist (life sciences)  . Smokeless tobacco: None  . Alcohol Use: No   Medication:    Home Medication:  No current outpatient prescriptions on file.  Current Medication: @CURMEDTAB @   Allergies:  Review of patient's allergies indicates no known allergies.  Review of Systems: Gen:  Denies  fever, sweats, chills HEENT: Denies blurred vision, double vision, ear pain, eye pain, hearing loss, nose bleeds, sore throat Cvc:  No dizziness, chest pain or heaviness Resp: Less sob, no wheezing, on 02 chronically   Gi: Denies swallowing difficulty, stomach pain, nausea or vomiting, diarrhea, constipation, bowel incontinence Gu:  Denies bladder incontinence, burning urine Ext:   No Joint  pain, stiffness or swelling Skin: No skin rash, easy bruising or bleeding or hives Endoc:  No polyuria, polydipsia , polyphagia or weight change Psych: No depression, insomnia or hallucinations  Other:  All other systems negative  Physical Examination:   VS: BP 120/40 mmHg  Pulse 71  Temp(Src) 98.2 F (36.8 C) (Oral)  Resp 20  Ht 5\' 1"  (1.549 m)  Wt 136 lb 9.6 oz (61.961 kg)  BMI 25.82 kg/m2  SpO2 96%  General Appearance: No distress, small reframe  Neuro: without focal findings, mental status, speech normal, alert and oriented, cranial nerves 2-12 intact, reflexes normal and symmetric, sensation grossly normal  HEENT: PERRLA, EOM intact, no ptosis, no other lesions noticed, Mallampati: Pulmonary:.rare wheezing, No rales    Cardiovascular:  Normal S1,S2.  No m/r/g.  .    Abdomen:Benign, Soft, non-tender, No masses, hepatosplenomegaly, No lymphadenopathy Endoc: No evident thyromegaly, no signs of acromegaly or Cushing features Skin:   warm, no rashes, no ecchymosis  Extremities: normal, no cyanosis, clubbing, no edema, warm with normal capillary refill. Other findings:   Labs results:     Rad results:   Dg Chest 2 View  11/23/2015  CLINICAL DATA:  Cough with shortness of breath EXAM: CHEST  2 VIEW COMPARISON:  November 20, 2015 FINDINGS: There is atelectatic change in the left mid lung and left base regions. There is no edema or consolidation. Heart size and pulmonary vascularity  are within normal limits. No adenopathy. There is atherosclerotic calcification in the aorta. There is degenerative change in each shoulder. IMPRESSION: Areas of atelectatic change in the left. Lungs elsewhere clear. Extensive atherosclerotic calcification. Electronically Signed   By: Lowella Grip III M.D.   On: 11/23/2015 08:03      Assessment and Plan: Copd exacerbation, improved, safe to go home Resume home regimen pred taper o2 Out patient f/i in 1-2 weeks  Community acquired  pneumonia Complete ten days of antibiotics Out patient f/u cxr     I have personally obtained a history, examined the patient, evaluated laboratory and imaging results, formulated the assessment and plan and placed orders.  The Patient requires high complexity decision making for assessment and support, frequent evaluation and titration of therapies, application of advanced monitoring technologies and extensive interpretation of multiple databases.   Herbon Fleming,M.D. Pulmonary & Critical care Medicine [facility]

## 2015-11-23 NOTE — Progress Notes (Signed)
DISCHARGE NOTE:  Pt given discharge instructions and verbalized understanding. Pt wheeled to car by staff.

## 2015-11-23 NOTE — Care Management Important Message (Signed)
Important Message  Patient Details  Name: Autumn Moore MRN: [id number] Date of Birth: Aug 29, 1941   Medicare Important Message Given:  Yes    Annaliz Aven A, RN 11/23/2015, 8:27 AM

## 2015-11-23 NOTE — Discharge Instructions (Signed)
Use your oxygen as before °

## 2015-11-23 NOTE — Discharge Summary (Signed)
[facility] NAME: Autumn Moore    MR#:  [id number]  DATE OF BIRTH:  07-Dec-1940  DATE OF ADMISSION:  11/19/2015 ADMITTING PHYSICIAN: Idelle Crouch, MD  DATE OF DISCHARGE: 11/23/15  PRIMARY CARE PHYSICIAN: Maryland Pink, MD    ADMISSION DIAGNOSIS:  Atypical pneumonia [J18.9] CAP (community acquired pneumonia) [J18.9] COPD with exacerbation ([place]) [J44.1] Acute on chronic respiratory failure with hypoxia ([place]) [J96.21]  DISCHARGE DIAGNOSIS:  Acute on chronic respiratory failure due to COPD flare Pneumonia-CAP Chronic home oxygen  SECONDARY DIAGNOSIS:   Past Medical History  Diagnosis Date  . COPD (chronic obstructive pulmonary disease) ([place])   . Hypertension   . CAD (coronary artery disease)     HOSPITAL COURSE:  * Acute hypoxic respiratory failure Due to community-acquired pneumonia and COPD exacerbation  She is on home o2 2-3 ltr.  on rocephin and azithromycin.---change to po keflex and zithromax -feels a lot better  IV steroid- switched to oral , nebulizer therapy, antibiotics.   Appreciated pulmonary consult with dr Raul Del  Encouraged to use flutter valve.  ok to go home per dr Raul Del  * Hypertension  Continue amlodipine and carvedilol.  * hyperglycemia- due to steroids.  Overall at baseline. D/c home  CONSULTS OBTAINED:  Treatment Team:  Erby Pian, MD  DRUG ALLERGIES:  No Known Allergies  DISCHARGE MEDICATIONS:   Current Discharge Medication List    START taking these medications   Details  azithromycin (ZITHROMAX) 250 MG tablet Take as directed Qty: 4 each, Refills: 0    cephALEXin (KEFLEX) 500 MG capsule Take 1 capsule (500 mg total) by mouth every 12 (twelve) hours. Qty: 8 capsule, Refills: 0    predniSONE (DELTASONE) 10 MG tablet Start with 50 mg daily taper by 10 mg daily thens top Qty: 15 tablet, Refills: 0      CONTINUE these medications which have  CHANGED   Details  aspirin 81 MG tablet Take 1 tablet (81 mg total) by mouth every other day. Qty: 30 tablet, Refills: 1      CONTINUE these medications which have NOT CHANGED   Details  albuterol (PROVENTIL HFA;VENTOLIN HFA) 108 (90 BASE) MCG/ACT inhaler Inhale 2 puffs into the lungs every 6 (six) hours as needed for wheezing or shortness of breath.    amLODipine (NORVASC) 2.5 MG tablet Take 2.5 mg by mouth daily.    Ascorbic Acid (VITAMIN C) 500 MG CAPS Take 500 mg by mouth daily.     aspirin EC 81 MG tablet Take 81 mg by mouth every other day.    budesonide-formoterol (SYMBICORT) 160-4.5 MCG/ACT inhaler Inhale 2 puffs into the lungs every other day.     Calcium 500 MG tablet Take 500 mg by mouth daily.    carvedilol (COREG) 6.25 MG tablet Take 6.25 mg by mouth 2 (two) times daily with a meal.    Cholecalciferol (VITAMIN D-1000 MAX ST) 1000 units tablet Take 1,000 Units by mouth daily.    Multiple Vitamin (MULTIVITAMIN WITH MINERALS) TABS tablet Take 1 tablet by mouth daily.    omeprazole (PRILOSEC) 20 MG capsule Take 20 mg by mouth every other day.    OXYGEN Inhale 2 L/min into the lungs continuous.     Pyridoxine HCl (B-6 PO) Take 1 tablet by mouth daily.    tiotropium (SPIRIVA) 18 MCG inhalation capsule Place 18 mcg into inhaler and inhale every other day.    vitamin B-12 (CYANOCOBALAMIN) 50 MCG tablet Take  50 mcg by mouth daily.    vitamin E 400 UNIT capsule Take 400 Units by mouth daily.        If you experience worsening of your admission symptoms, develop shortness of breath, life threatening emergency, suicidal or homicidal thoughts you must seek medical attention immediately by calling 911 or calling your MD immediately  if symptoms less severe.  You Must read complete instructions/literature along with all the possible adverse reactions/side effects for all the Medicines you take and that have been prescribed to you. Take any new Medicines after you have  completely understood and accept all the possible adverse reactions/side effects.   Please note  You were cared for by a hospitalist during your hospital stay. If you have any questions about your discharge medications or the care you received while you were in the hospital after you are discharged, you can call the unit and asked to speak with the hospitalist on call if the hospitalist that took care of you is not available. Once you are discharged, your primary care physician will handle any further medical issues. Please note that NO REFILLS for any discharge medications will be authorized once you are discharged, as it is imperative that you return to your primary care physician (or establish a relationship with a primary care physician if you do not have one) for your aftercare needs so that they can reassess your need for medications and monitor your lab values. Today   SUBJECTIVE     VITAL SIGNS:  Blood pressure 120/40, pulse 71, temperature 98.2 F (36.8 C), temperature source Oral, resp. rate 20, height 5\' 1"  (1.549 m), weight 61.961 kg (136 lb 9.6 oz), SpO2 96 %.  I/O:   Intake/Output Summary (Last 24 hours) at 11/23/15 1315 Last data filed at 11/23/15 1306  Gross per 24 hour  Intake    480 ml  Output      0 ml  Net    480 ml    PHYSICAL EXAMINATION:  GENERAL:  75 y.o.-year-old patient lying in the bed with no acute distress.  EYES: Pupils equal, round, reactive to light and accommodation. No scleral icterus. Extraocular muscles intact.  HEENT: Head atraumatic, normocephalic. Oropharynx and nasopharynx clear.  NECK:  Supple, no jugular venous distention. No thyroid enlargement, no tenderness.  LUNGS: Normal breath sounds bilaterally, no wheezing, rales,rhonchi or crepitation. No use of accessory muscles of respiration.  CARDIOVASCULAR: S1, S2 normal. No murmurs, rubs, or gallops.  ABDOMEN: Soft, non-tender, non-distended. Bowel sounds present. No organomegaly or mass.   EXTREMITIES: No pedal edema, cyanosis, or clubbing.  NEUROLOGIC: Cranial nerves II through XII are intact. Muscle strength 5/5 in all extremities. Sensation intact. Gait not checked.  PSYCHIATRIC: The patient is alert and oriented x 3.  SKIN: No obvious rash, lesion, or ulcer.   DATA REVIEW:   CBC   Recent Labs Lab 11/20/15 0432  WBC 3.8  HGB 11.8*  HCT 36.3  PLT 145*    Chemistries   Recent Labs Lab 11/20/15 0432  NA 144  K 4.0  CL 100*  CO2 37*  GLUCOSE 169*  BUN 21*  CREATININE 0.53  CALCIUM 8.5*  AST 52*  ALT 55*  ALKPHOS 53  BILITOT 0.4    Microbiology Results   Recent Results (from the past 240 hour(s))  Rapid Influenza A&B Antigens ([place] only)     Status: None   Collection Time: 11/19/15  5:25 PM  Result Value Ref Range Status   Influenza A [facility])  NEGATIVE  Final   Influenza B ([facility]) NEGATIVE  Final  Blood culture (routine x 2)     Status: None (Preliminary result)   Collection Time: 11/19/15  6:37 PM  Result Value Ref Range Status   Specimen Description BLOOD RIGHT ANTECUBITAL  Final   Special Requests   Final    BOTTLES DRAWN AEROBIC AND ANAEROBIC  AER 6CC ANA 3CC   Culture NO GROWTH 4 DAYS  Final   Report Status PENDING  Incomplete  Blood culture (routine x 2)     Status: None (Preliminary result)   Collection Time: 11/19/15  6:37 PM  Result Value Ref Range Status   Specimen Description BLOOD LEFT ANTECUBITAL  Final   Special Requests BOTTLES DRAWN AEROBIC AND ANAEROBIC  4CC  Final   Culture NO GROWTH 4 DAYS  Final   Report Status PENDING  Incomplete    RADIOLOGY:  Dg Chest 2 View  11/23/2015  CLINICAL DATA:  Cough with shortness of breath EXAM: CHEST  2 VIEW COMPARISON:  November 20, 2015 FINDINGS: There is atelectatic change in the left mid lung and left base regions. There is no edema or consolidation. Heart size and pulmonary vascularity are within normal limits. No adenopathy. There is atherosclerotic calcification in the aorta. There  is degenerative change in each shoulder. IMPRESSION: Areas of atelectatic change in the left. Lungs elsewhere clear. Extensive atherosclerotic calcification. Electronically Signed   By: Lowella Grip III M.D.   On: 11/23/2015 08:03     Management plans discussed with the patient, family and they are in agreement.  CODE STATUS:     Code Status Orders        Start     Ordered   11/19/15 2116  Full code   Continuous     11/19/15 2116    Code Status History    Date Active Date Inactive Code Status Order ID Comments User Context   This patient has a current code status but no historical code status.      TOTAL TIME TAKING CARE OF THIS PATIENT:40 minutes.    Kehinde Totzke M.D on 11/23/2015 at 1:15 PM  Between 7am to 6pm - Pager - [phone] After 6pm go to [email or web address] - password [email or web address] [facility]  [facility]  Office  [phone]  CC: Primary care physician; Maryland Pink, MD

## 2015-11-24 LAB — CULTURE, BLOOD (ROUTINE X 2)
Culture: NO GROWTH
Culture: NO GROWTH

## 2016-05-10 ENCOUNTER — Other Ambulatory Visit: Payer: Self-pay | Admitting: Specialist

## 2016-05-10 DIAGNOSIS — R918 Other nonspecific abnormal finding of lung field: Secondary | ICD-10-CM

## 2016-06-29 ENCOUNTER — Ambulatory Visit
Admission: RE | Admit: 2016-06-29 | Discharge: 2016-06-29 | Disposition: A | Discharge status: Home / Self Care | Payer: Medicare Other | Source: Ambulatory Visit | Attending: Specialist | Admitting: Specialist

## 2016-06-29 DIAGNOSIS — J439 Emphysema, unspecified: Secondary | ICD-10-CM | POA: Insufficient documentation

## 2016-06-29 DIAGNOSIS — I2510 Atherosclerotic heart disease of native coronary artery without angina pectoris: Secondary | ICD-10-CM | POA: Insufficient documentation

## 2016-06-29 DIAGNOSIS — I700 Atherosclerosis of aorta: Secondary | ICD-10-CM | POA: Diagnosis not present

## 2016-06-29 DIAGNOSIS — D3500 Benign neoplasm of unspecified adrenal gland: Secondary | ICD-10-CM | POA: Diagnosis not present

## 2016-06-29 DIAGNOSIS — R918 Other nonspecific abnormal finding of lung field: Secondary | ICD-10-CM | POA: Diagnosis not present

## 2016-12-06 ENCOUNTER — Inpatient Hospital Stay: Payer: Medicare Other

## 2016-12-06 ENCOUNTER — Emergency Department: Payer: Medicare Other

## 2016-12-06 ENCOUNTER — Inpatient Hospital Stay
Admission: EM | Admit: 2016-12-06 | Discharge: 2016-12-30 | DRG: 208 | Disposition: E | Payer: Medicare Other | Attending: Pulmonary Disease | Admitting: Pulmonary Disease

## 2016-12-06 ENCOUNTER — Encounter: Payer: Self-pay | Admitting: *Deleted

## 2016-12-06 DIAGNOSIS — Z7982 Long term (current) use of aspirin: Secondary | ICD-10-CM | POA: Diagnosis not present

## 2016-12-06 DIAGNOSIS — K7200 Acute and subacute hepatic failure without coma: Secondary | ICD-10-CM | POA: Diagnosis present

## 2016-12-06 DIAGNOSIS — R092 Respiratory arrest: Secondary | ICD-10-CM

## 2016-12-06 DIAGNOSIS — G931 Anoxic brain damage, not elsewhere classified: Secondary | ICD-10-CM | POA: Diagnosis present

## 2016-12-06 DIAGNOSIS — I471 Supraventricular tachycardia: Secondary | ICD-10-CM | POA: Diagnosis present

## 2016-12-06 DIAGNOSIS — Z4659 Encounter for fitting and adjustment of other gastrointestinal appliance and device: Secondary | ICD-10-CM

## 2016-12-06 DIAGNOSIS — N179 Acute kidney failure, unspecified: Secondary | ICD-10-CM | POA: Diagnosis not present

## 2016-12-06 DIAGNOSIS — J9600 Acute respiratory failure, unspecified whether with hypoxia or hypercapnia: Secondary | ICD-10-CM

## 2016-12-06 DIAGNOSIS — R579 Shock, unspecified: Secondary | ICD-10-CM | POA: Diagnosis not present

## 2016-12-06 DIAGNOSIS — E875 Hyperkalemia: Secondary | ICD-10-CM | POA: Diagnosis present

## 2016-12-06 DIAGNOSIS — Z515 Encounter for palliative care: Secondary | ICD-10-CM | POA: Diagnosis present

## 2016-12-06 DIAGNOSIS — Z7951 Long term (current) use of inhaled steroids: Secondary | ICD-10-CM

## 2016-12-06 DIAGNOSIS — R001 Bradycardia, unspecified: Secondary | ICD-10-CM | POA: Diagnosis present

## 2016-12-06 DIAGNOSIS — J939 Pneumothorax, unspecified: Secondary | ICD-10-CM

## 2016-12-06 DIAGNOSIS — E874 Mixed disorder of acid-base balance: Secondary | ICD-10-CM | POA: Diagnosis present

## 2016-12-06 DIAGNOSIS — J930 Spontaneous tension pneumothorax: Secondary | ICD-10-CM | POA: Diagnosis not present

## 2016-12-06 DIAGNOSIS — A419 Sepsis, unspecified organism: Secondary | ICD-10-CM

## 2016-12-06 DIAGNOSIS — Z8551 Personal history of malignant neoplasm of bladder: Secondary | ICD-10-CM

## 2016-12-06 DIAGNOSIS — I469 Cardiac arrest, cause unspecified: Secondary | ICD-10-CM | POA: Diagnosis present

## 2016-12-06 DIAGNOSIS — Z66 Do not resuscitate: Secondary | ICD-10-CM | POA: Diagnosis present

## 2016-12-06 DIAGNOSIS — Z87891 Personal history of nicotine dependence: Secondary | ICD-10-CM

## 2016-12-06 DIAGNOSIS — J9621 Acute and chronic respiratory failure with hypoxia: Secondary | ICD-10-CM | POA: Diagnosis present

## 2016-12-06 DIAGNOSIS — I2510 Atherosclerotic heart disease of native coronary artery without angina pectoris: Secondary | ICD-10-CM | POA: Diagnosis present

## 2016-12-06 DIAGNOSIS — Z7189 Other specified counseling: Secondary | ICD-10-CM

## 2016-12-06 DIAGNOSIS — E782 Mixed hyperlipidemia: Secondary | ICD-10-CM | POA: Diagnosis present

## 2016-12-06 DIAGNOSIS — J441 Chronic obstructive pulmonary disease with (acute) exacerbation: Secondary | ICD-10-CM | POA: Diagnosis present

## 2016-12-06 DIAGNOSIS — I4901 Ventricular fibrillation: Secondary | ICD-10-CM | POA: Diagnosis present

## 2016-12-06 DIAGNOSIS — R6521 Severe sepsis with septic shock: Secondary | ICD-10-CM

## 2016-12-06 DIAGNOSIS — Z452 Encounter for adjustment and management of vascular access device: Secondary | ICD-10-CM

## 2016-12-06 DIAGNOSIS — R000 Tachycardia, unspecified: Secondary | ICD-10-CM | POA: Diagnosis present

## 2016-12-06 DIAGNOSIS — I10 Essential (primary) hypertension: Secondary | ICD-10-CM | POA: Diagnosis present

## 2016-12-06 DIAGNOSIS — R402432 Glasgow coma scale score 3-8, at arrival to emergency department: Secondary | ICD-10-CM | POA: Diagnosis present

## 2016-12-06 DIAGNOSIS — Z79899 Other long term (current) drug therapy: Secondary | ICD-10-CM | POA: Diagnosis not present

## 2016-12-06 DIAGNOSIS — Z9981 Dependence on supplemental oxygen: Secondary | ICD-10-CM | POA: Diagnosis not present

## 2016-12-06 DIAGNOSIS — I248 Other forms of acute ischemic heart disease: Secondary | ICD-10-CM | POA: Diagnosis present

## 2016-12-06 DIAGNOSIS — T462X5A Adverse effect of other antidysrhythmic drugs, initial encounter: Secondary | ICD-10-CM | POA: Diagnosis not present

## 2016-12-06 LAB — BLOOD GAS, ARTERIAL
ACID-BASE DEFICIT: 12.9 mmol/L — AB (ref 0.0–2.0)
ACID-BASE DEFICIT: 12 mmol/L — AB (ref 0.0–2.0)
Bicarbonate: 17.8 mmol/L — ABNORMAL LOW (ref 20.0–28.0)
Bicarbonate: 19.7 mmol/L — ABNORMAL LOW (ref 20.0–28.0)
FIO2: 1
FIO2: 0.6
LHR: 16 {breaths}/min
MECHVT: 450 mL
O2 SAT: 99.6 %
O2 SAT: 91.2 %
PCO2 ART: 63 mmHg — AB (ref 32.0–48.0)
PCO2 ART: 73 mmHg — AB (ref 32.0–48.0)
PEEP: 5 cmH2O
PEEP: 5 cmH2O
PH ART: 7.06 — AB (ref 7.350–7.450)
PH ART: 7.04 — AB (ref 7.350–7.450)
Patient temperature: 37
Patient temperature: 37
RATE: 14 resp/min
VT: 450 mL
pO2, Arterial: 233 mmHg — ABNORMAL HIGH (ref 83.0–108.0)
pO2, Arterial: 88 mmHg (ref 83.0–108.0)

## 2016-12-06 LAB — CBC WITH DIFFERENTIAL/PLATELET
BASOS PCT: 0 %
Band Neutrophils: 16 %
Basophils Absolute: 0 10*3/uL (ref 0–0.1)
Blasts: 0 %
Eosinophils Absolute: 0 10*3/uL (ref 0–0.7)
Eosinophils Relative: 0 %
HCT: 41 % (ref 35.0–47.0)
HEMOGLOBIN: 12.7 g/dL (ref 12.0–16.0)
LYMPHS ABS: 3.6 10*3/uL (ref 1.0–3.6)
Lymphocytes Relative: 46 %
MCH: 30.2 pg (ref 26.0–34.0)
MCHC: 31.1 g/dL — ABNORMAL LOW (ref 32.0–36.0)
MCV: 97.2 fL (ref 80.0–100.0)
METAMYELOCYTES PCT: 5 %
MONO ABS: 0.5 10*3/uL (ref 0.2–0.9)
MYELOCYTES: 1 %
Monocytes Relative: 6 %
NEUTROS PCT: 26 %
NRBC: 3 /100{WBCs} — AB
Neutro Abs: 3.8 10*3/uL (ref 1.4–6.5)
Other: 0 %
PLATELETS: 116 10*3/uL — AB (ref 150–440)
PROMYELOCYTES ABS: 0 %
RBC: 4.22 MIL/uL (ref 3.80–5.20)
RDW: 14.6 % — ABNORMAL HIGH (ref 11.5–14.5)
WBC: 7.9 10*3/uL (ref 3.6–11.0)

## 2016-12-06 LAB — COMPREHENSIVE METABOLIC PANEL
ALBUMIN: 2.5 g/dL — AB (ref 3.5–5.0)
ALK PHOS: 105 U/L (ref 38–126)
ALT: 2190 U/L — AB (ref 14–54)
AST: 4056 U/L — ABNORMAL HIGH (ref 15–41)
Anion gap: 18 — ABNORMAL HIGH (ref 5–15)
BUN: 58 mg/dL — ABNORMAL HIGH (ref 6–20)
CHLORIDE: 100 mmol/L — AB (ref 101–111)
CO2: 22 mmol/L (ref 22–32)
CREATININE: 2.1 mg/dL — AB (ref 0.44–1.00)
GFR calc non Af Amer: 22 mL/min — ABNORMAL LOW (ref >60)
GFR, EST AFRICAN AMERICAN: 25 mL/min — AB (ref >60)
Glucose, Bld: 66 mg/dL (ref 65–99)
Potassium: 5.5 mmol/L — ABNORMAL HIGH (ref 3.5–5.1)
SODIUM: 140 mmol/L (ref 135–145)
Total Bilirubin: 0.8 mg/dL (ref 0.3–1.2)
Total Protein: 6 g/dL — ABNORMAL LOW (ref 6.5–8.1)

## 2016-12-06 LAB — BASIC METABOLIC PANEL
Anion gap: 12 (ref 5–15)
BUN: 65 mg/dL — AB (ref 6–20)
CO2: 26 mmol/L (ref 22–32)
CREATININE: 2.33 mg/dL — AB (ref 0.44–1.00)
Calcium: 7.5 mg/dL — ABNORMAL LOW (ref 8.9–10.3)
Chloride: 99 mmol/L — ABNORMAL LOW (ref 101–111)
GFR calc Af Amer: 22 mL/min — ABNORMAL LOW (ref >60)
GFR, EST NON AFRICAN AMERICAN: 19 mL/min — AB (ref >60)
GLUCOSE: 294 mg/dL — AB (ref 65–99)
POTASSIUM: 4.5 mmol/L (ref 3.5–5.1)
SODIUM: 137 mmol/L (ref 135–145)

## 2016-12-06 LAB — GLUCOSE, CAPILLARY
GLUCOSE-CAPILLARY: 132 mg/dL — AB (ref 65–99)
GLUCOSE-CAPILLARY: 216 mg/dL — AB (ref 65–99)
Glucose-Capillary: 241 mg/dL — ABNORMAL HIGH (ref 65–99)
Glucose-Capillary: 93 mg/dL (ref 65–99)

## 2016-12-06 LAB — MRSA PCR SCREENING: MRSA BY PCR: NEGATIVE

## 2016-12-06 LAB — URINALYSIS, COMPLETE (UACMP) WITH MICROSCOPIC
BACTERIA UA: NONE SEEN
Bilirubin Urine: NEGATIVE
GLUCOSE, UA: NEGATIVE mg/dL
HGB URINE DIPSTICK: NEGATIVE
KETONES UR: NEGATIVE mg/dL
LEUKOCYTES UA: NEGATIVE
NITRITE: NEGATIVE
PH: 5 (ref 5.0–8.0)
PROTEIN: 100 mg/dL — AB
Specific Gravity, Urine: 1.015 (ref 1.005–1.030)

## 2016-12-06 LAB — PROCALCITONIN: PROCALCITONIN: 1.45 ng/mL

## 2016-12-06 LAB — TSH: TSH: 1.02 u[IU]/mL (ref 0.350–4.500)

## 2016-12-06 LAB — INFLUENZA PANEL BY PCR (TYPE A & B)
INFLAPCR: NEGATIVE
INFLBPCR: NEGATIVE

## 2016-12-06 LAB — POTASSIUM: POTASSIUM: 4.5 mmol/L (ref 3.5–5.1)

## 2016-12-06 LAB — PROTIME-INR
INR: 1.85
INR: 2.28
Prothrombin Time: 21.6 seconds — ABNORMAL HIGH (ref 11.4–15.2)
Prothrombin Time: 25.5 seconds — ABNORMAL HIGH (ref 11.4–15.2)

## 2016-12-06 LAB — LACTIC ACID, PLASMA
Lactic Acid, Venous: 12.9 mmol/L (ref 0.5–1.9)
Lactic Acid, Venous: 7.8 mmol/L (ref 0.5–1.9)
Lactic Acid, Venous: 3.9 mmol/L (ref 0.5–1.9)

## 2016-12-06 LAB — TROPONIN I
TROPONIN I: 0.06 ng/mL — AB (ref <0.03)
Troponin I: 0.07 ng/mL (ref <0.03)
Troponin I: 0.42 ng/mL (ref <0.03)

## 2016-12-06 LAB — MAGNESIUM
MAGNESIUM: 2.3 mg/dL (ref 1.7–2.4)
MAGNESIUM: 1.8 mg/dL (ref 1.7–2.4)

## 2016-12-06 LAB — LIPASE, BLOOD: Lipase: <10 U/L — ABNORMAL LOW (ref 11–51)

## 2016-12-06 LAB — PHOSPHORUS: PHOSPHORUS: 8.9 mg/dL — AB (ref 2.5–4.6)

## 2016-12-06 LAB — APTT: aPTT: 41 seconds — ABNORMAL HIGH (ref 24–36)

## 2016-12-06 MED ORDER — SODIUM CHLORIDE 0.9 % IV BOLUS (SEPSIS)
1000.0000 mL | Freq: Once | INTRAVENOUS | Status: AC
  Administered 2016-12-06: 1000 mL via INTRAVENOUS

## 2016-12-06 MED ORDER — ASPIRIN 300 MG RE SUPP
RECTAL | Status: AC
  Administered 2016-12-06: 300 mg via RECTAL
  Filled 2016-12-06: qty 1

## 2016-12-06 MED ORDER — VASOPRESSIN 20 UNIT/ML IV SOLN
0.0300 [IU]/min | INTRAVENOUS | Status: DC
  Administered 2016-12-06: 0.03 [IU]/min via INTRAVENOUS
  Filled 2016-12-06 (×2): qty 2

## 2016-12-06 MED ORDER — VANCOMYCIN HCL IN DEXTROSE 750-5 MG/150ML-% IV SOLN
750.0000 mg | INTRAVENOUS | Status: DC
  Administered 2016-12-07: 750 mg via INTRAVENOUS
  Filled 2016-12-06: qty 150

## 2016-12-06 MED ORDER — AMIODARONE LOAD VIA INFUSION
150.0000 mg | Freq: Once | INTRAVENOUS | Status: DC
  Filled 2016-12-06: qty 83.34

## 2016-12-06 MED ORDER — IPRATROPIUM-ALBUTEROL 0.5-2.5 (3) MG/3ML IN SOLN
9.0000 mL | Freq: Once | RESPIRATORY_TRACT | Status: AC
  Administered 2016-12-06: 9 mL via RESPIRATORY_TRACT
  Filled 2016-12-06: qty 9

## 2016-12-06 MED ORDER — DOPAMINE-DEXTROSE 3.2-5 MG/ML-% IV SOLN
0.0000 ug/kg/min | Freq: Once | INTRAVENOUS | Status: AC
  Administered 2016-12-06: 10 ug/kg/min via INTRAVENOUS

## 2016-12-06 MED ORDER — LEVALBUTEROL HCL 0.63 MG/3ML IN NEBU
0.6300 mg | INHALATION_SOLUTION | RESPIRATORY_TRACT | Status: DC
  Administered 2016-12-06 – 2016-12-07 (×5): 0.63 mg via RESPIRATORY_TRACT
  Filled 2016-12-06 (×5): qty 3

## 2016-12-06 MED ORDER — DEXTROSE 5 % IV SOLN
2.0000 g | Freq: Once | INTRAVENOUS | Status: AC
  Administered 2016-12-06: 2 g via INTRAVENOUS
  Filled 2016-12-06: qty 2

## 2016-12-06 MED ORDER — NOREPINEPHRINE BITARTRATE 1 MG/ML IV SOLN
0.0000 ug/min | INTRAVENOUS | Status: DC
  Administered 2016-12-06: 30 ug/min via INTRAVENOUS
  Administered 2016-12-07: 4 ug/min via INTRAVENOUS
  Filled 2016-12-06 (×2): qty 16

## 2016-12-06 MED ORDER — METHYLPREDNISOLONE SODIUM SUCC 125 MG IJ SOLR
125.0000 mg | Freq: Once | INTRAMUSCULAR | Status: AC
  Administered 2016-12-06: 125 mg via INTRAVENOUS
  Filled 2016-12-06: qty 2

## 2016-12-06 MED ORDER — SODIUM CHLORIDE 0.9 % IV SOLN
INTRAVENOUS | Status: DC
  Administered 2016-12-06: 17:00:00 via INTRAVENOUS

## 2016-12-06 MED ORDER — MIDAZOLAM HCL 2 MG/2ML IJ SOLN
1.0000 mg | INTRAMUSCULAR | Status: DC | PRN
  Filled 2016-12-06 (×2): qty 2

## 2016-12-06 MED ORDER — MIDAZOLAM HCL 2 MG/2ML IJ SOLN
1.0000 mg | Freq: Once | INTRAMUSCULAR | Status: AC
  Administered 2016-12-06: 1 mg via INTRAVENOUS

## 2016-12-06 MED ORDER — INSULIN ASPART 100 UNIT/ML ~~LOC~~ SOLN
0.0000 [IU] | SUBCUTANEOUS | Status: DC
  Administered 2016-12-07 (×2): 5 [IU] via SUBCUTANEOUS
  Administered 2016-12-07 (×2): 3 [IU] via SUBCUTANEOUS
  Filled 2016-12-06: qty 5
  Filled 2016-12-06 (×2): qty 3
  Filled 2016-12-06: qty 5

## 2016-12-06 MED ORDER — SODIUM CHLORIDE 0.9 % IV SOLN
1.0000 g | Freq: Once | INTRAVENOUS | Status: AC
  Administered 2016-12-06: 1 g via INTRAVENOUS

## 2016-12-06 MED ORDER — DOPAMINE-DEXTROSE 3.2-5 MG/ML-% IV SOLN
INTRAVENOUS | Status: AC | PRN
  Administered 2016-12-06: 10 ug/kg/min via INTRAVENOUS

## 2016-12-06 MED ORDER — NOREPINEPHRINE BITARTRATE 1 MG/ML IV SOLN
0.0000 ug/min | Freq: Once | INTRAVENOUS | Status: DC
  Filled 2016-12-06: qty 4

## 2016-12-06 MED ORDER — FENTANYL CITRATE (PF) 100 MCG/2ML IJ SOLN: 50.0000 ug | Freq: Once | INTRAMUSCULAR | Status: DC

## 2016-12-06 MED ORDER — ASPIRIN 300 MG RE SUPP
300.0000 mg | RECTAL | Status: AC
  Administered 2016-12-06: 300 mg via RECTAL

## 2016-12-06 MED ORDER — NOREPINEPHRINE BITARTRATE 1 MG/ML IV SOLN
0.0000 ug/min | INTRAVENOUS | Status: DC
  Filled 2016-12-06: qty 4

## 2016-12-06 MED ORDER — IPRATROPIUM-ALBUTEROL 0.5-2.5 (3) MG/3ML IN SOLN
3.0000 mL | RESPIRATORY_TRACT | Status: DC
  Administered 2016-12-06: 3 mL via RESPIRATORY_TRACT
  Filled 2016-12-06 (×2): qty 3

## 2016-12-06 MED ORDER — LORAZEPAM 2 MG/ML IJ SOLN
1.0000 mg | INTRAMUSCULAR | Status: DC | PRN
  Administered 2016-12-06 – 2016-12-07 (×4): 1 mg via INTRAVENOUS
  Filled 2016-12-06 (×4): qty 1

## 2016-12-06 MED ORDER — DEXTROSE 5 % IV SOLN
1.0000 g | INTRAVENOUS | Status: DC
  Filled 2016-12-06: qty 1

## 2016-12-06 MED ORDER — CEFEPIME-DEXTROSE 1 GM/50ML IV SOLR
INTRAVENOUS | Status: AC
  Filled 2016-12-06: qty 50

## 2016-12-06 MED ORDER — FENTANYL 2500MCG IN NS 250ML (10MCG/ML) PREMIX INFUSION
25.0000 ug/h | INTRAVENOUS | Status: DC
  Filled 2016-12-06: qty 250

## 2016-12-06 MED ORDER — AMIODARONE HCL IN DEXTROSE 360-4.14 MG/200ML-% IV SOLN
30.0000 mg/h | INTRAVENOUS | Status: DC
  Administered 2016-12-06: 30 mg/h via INTRAVENOUS

## 2016-12-06 MED ORDER — FENTANYL CITRATE (PF) 100 MCG/2ML IJ SOLN: 50.0000 ug | INTRAMUSCULAR | Status: DC | PRN

## 2016-12-06 MED ORDER — SODIUM CHLORIDE 0.9 % IV SOLN
1000.0000 mg | Freq: Once | INTRAVENOUS | Status: AC
  Administered 2016-12-06: 1000 mg via INTRAVENOUS
  Filled 2016-12-06: qty 10

## 2016-12-06 MED ORDER — NOREPINEPHRINE BITARTRATE 1 MG/ML IV SOLN
0.0000 ug/min | Freq: Once | INTRAVENOUS | Status: AC
  Administered 2016-12-06: 10 ug/min via INTRAVENOUS
  Filled 2016-12-06: qty 4

## 2016-12-06 MED ORDER — SODIUM CHLORIDE 0.9 % IV SOLN: INTRAVENOUS | Status: DC | PRN

## 2016-12-06 MED ORDER — METHYLPREDNISOLONE SODIUM SUCC 40 MG IJ SOLR
40.0000 mg | Freq: Two times a day (BID) | INTRAMUSCULAR | Status: DC
  Administered 2016-12-06 – 2016-12-07 (×2): 40 mg via INTRAVENOUS
  Filled 2016-12-06 (×2): qty 1

## 2016-12-06 MED ORDER — AMIODARONE HCL IN DEXTROSE 360-4.14 MG/200ML-% IV SOLN
60.0000 mg/h | INTRAVENOUS | Status: DC
  Filled 2016-12-06: qty 200

## 2016-12-06 MED ORDER — VANCOMYCIN HCL IN DEXTROSE 1-5 GM/200ML-% IV SOLN
1000.0000 mg | Freq: Once | INTRAVENOUS | Status: AC
  Administered 2016-12-06: 1000 mg via INTRAVENOUS
  Filled 2016-12-06: qty 200

## 2016-12-06 MED ORDER — PHENYLEPHRINE HCL 10 MG/ML IJ SOLN
0.0000 ug/min | INTRAMUSCULAR | Status: DC
  Administered 2016-12-06: 20 ug/min via INTRAVENOUS
  Administered 2016-12-07: 140 ug/min via INTRAVENOUS
  Administered 2016-12-07: 95 ug/min via INTRAVENOUS
  Filled 2016-12-06 (×3): qty 4

## 2016-12-06 MED ORDER — HEPARIN SODIUM (PORCINE) 5000 UNIT/ML IJ SOLN
5000.0000 [IU] | Freq: Three times a day (TID) | INTRAMUSCULAR | Status: DC
  Administered 2016-12-06 – 2016-12-07 (×2): 5000 [IU] via SUBCUTANEOUS
  Filled 2016-12-06 (×2): qty 1

## 2016-12-06 MED ORDER — FENTANYL BOLUS VIA INFUSION
25.0000 ug | INTRAVENOUS | Status: DC | PRN
  Filled 2016-12-06: qty 25

## 2016-12-06 MED ORDER — FAMOTIDINE IN NACL 20-0.9 MG/50ML-% IV SOLN
20.0000 mg | Freq: Every day | INTRAVENOUS | Status: DC
  Administered 2016-12-06: 20 mg via INTRAVENOUS
  Filled 2016-12-06: qty 50

## 2016-12-06 MED ORDER — BUDESONIDE 0.25 MG/2ML IN SUSP
0.2500 mg | Freq: Two times a day (BID) | RESPIRATORY_TRACT | Status: DC
  Administered 2016-12-06 – 2016-12-07 (×2): 0.25 mg via RESPIRATORY_TRACT
  Filled 2016-12-06 (×2): qty 2

## 2016-12-06 MED ORDER — MIDAZOLAM HCL 2 MG/2ML IJ SOLN: 1.0000 mg | INTRAMUSCULAR | Status: DC | PRN

## 2016-12-06 MED ORDER — METHYLPREDNISOLONE SODIUM SUCC 125 MG IJ SOLR: 125.0000 mg | INTRAMUSCULAR | Status: DC

## 2016-12-06 MED ORDER — SODIUM CHLORIDE 0.9 % IV SOLN
INTRAVENOUS | Status: AC | PRN
  Administered 2016-12-06: 1000 mL via INTRAVENOUS

## 2016-12-06 MED ORDER — FENTANYL CITRATE (PF) 100 MCG/2ML IJ SOLN
50.0000 ug | INTRAMUSCULAR | Status: DC | PRN
  Administered 2016-12-07: 50 ug via INTRAVENOUS
  Filled 2016-12-06 (×3): qty 2

## 2016-12-06 MED ORDER — SODIUM CHLORIDE 0.9 % IV SOLN: 25.0000 ug/h | INTRAVENOUS | Status: DC

## 2016-12-06 NOTE — Progress Notes (Signed)
[facility] Progress Note Patient Name: JAMIRA BARFUSS DOB: 1941-07-22 MRN: [id number]   Date of Service  12/21/2016  HPI/Events of Note  Patient seen by intensivist. Currently undergoing therapeutic hypothermia. Family en route from [place].   eICU Interventions  Continuing plan of care per intensivist.      Intervention Category Evaluation Type: New Patient Evaluation  Tera Partridge 12/14/2016, 4:04 PM

## 2016-12-06 NOTE — Procedures (Signed)
Central Venous Catheter Insertion Procedure Note JULENE RAHN [id number] [birth year]  Procedure: Insertion of Central Venous Catheter Indications: Assessment of intravascular volume, Drug and/or fluid administration and Frequent blood sampling  Procedure Details Consent: Risks of procedure as well as the alternatives and risks of each were explained to the (patient/caregiver).  Consent for procedure obtained. Time Out: Verified patient identification, verified procedure, site/side was marked, verified correct patient position, special equipment/implants available, medications/allergies/relevent history reviewed, required imaging and test results available.  Performed  Maximum sterile technique was used including antiseptics, cap, gloves, gown, hand hygiene, mask and sheet. Skin prep: Chlorhexidine; local anesthetic administered A antimicrobial bonded/coated triple lumen catheter was placed in the right internal jugular vein using the Seldinger technique.  Evaluation Blood flow good Complications: No apparent complications Patient did tolerate procedure well. Chest X-ray ordered to verify placement.  CXR: pending.  Right internal jugular central line placed utilizing ultrasound no complications noted during or following procedure cxr pending.  Marda Stalker, [place] Pager [phone] (please enter 7 digits) [place] Pager [phone] (please enter 7 digits)  Merton Border, MD PCCM service Mobile [phone] Pager [phone] 2017-01-06

## 2016-12-06 NOTE — ED Notes (Signed)
Network engineer notified of code sepsis

## 2016-12-06 NOTE — Progress Notes (Signed)
  Spoke with the family about the code status.  Daughter Hector [place]) agrees to make the patient DNR  at this time  as she said  "She would not want her mother to suffer anymore and she is well aware that her prognosis is not good".     [facility]

## 2016-12-06 NOTE — ED Notes (Addendum)
Trop of 0.06, Dr. Rosita Fire notified, lactic 12.9 notified Dr. Rosita Fire

## 2016-12-06 NOTE — ED Provider Notes (Addendum)
[facility] Emergency Department Provider Note  ____________________________________________   First MD Initiated Contact with Patient 12/04/2016 1140     (approximate)  I have reviewed the triage vital signs and the nursing notes.   HISTORY  Chief Complaint Cardiac Arrest   HPI Autumn Moore is a 76 y.o. female with a history of COPD as well as CAD who is presenting to the emergency department after a cardiac and respiratory arrest. Per EMS, the patient was found down about 10 or 10:30 AM. EMS says that she could've had a downtime of up to 1 hour prior to that. Upon arrival of EMS they found her to be in asystole. She received multiple rounds of epinephrine as well as CPR. EMS able to regain pulses once and then lost pulses again. Patient with pulses upon arrival to the emergency department.    EMS also reports that when the patient was found down that she was breathing at that time.   Past Medical History:  Diagnosis Date  . CAD (coronary artery disease)   . COPD (chronic obstructive pulmonary disease) ([place])   . Hypertension     Patient Active Problem List   Diagnosis Date Noted  . Acute respiratory failure ([place]) 11/19/2015  . CAP (community acquired pneumonia) 11/19/2015  . COPD exacerbation ([place]) 11/19/2015  . Diarrhea 11/19/2015    Past Surgical History:  Procedure Laterality Date  . COLONOSCOPY WITH PROPOFOL N/A 03/07/2015   Procedure: COLONOSCOPY WITH PROPOFOL;  Surgeon: Hulen Luster, MD;  Location: [facility] ENDOSCOPY;  Service: Gastroenterology;  Laterality: N/A;  . ESOPHAGOGASTRODUODENOSCOPY N/A 03/07/2015   Procedure: ESOPHAGOGASTRODUODENOSCOPY (EGD);  Surgeon: Hulen Luster, MD;  Location: [facility] ENDOSCOPY;  Service: Gastroenterology;  Laterality: N/A;    Prior to Admission medications   Medication Sig Start Date End Date Taking? Authorizing Provider  albuterol (PROVENTIL HFA;VENTOLIN HFA) 108 (90 BASE) MCG/ACT inhaler Inhale 2 puffs into the lungs  every 6 (six) hours as needed for wheezing or shortness of breath.   Yes Historical Provider, MD  amLODipine (NORVASC) 2.5 MG tablet Take 2.5 mg by mouth daily.   Yes Historical Provider, MD  aspirin 81 MG tablet Take 1 tablet (81 mg total) by mouth every other day. 11/23/15  Yes Fritzi Mandes, MD  carvedilol (COREG) 6.25 MG tablet Take 6.25 mg by mouth 2 (two) times daily with a meal.   Yes Historical Provider, MD  omeprazole (PRILOSEC) 20 MG capsule Take 20 mg by mouth every other day.   Yes Historical Provider, MD  Ascorbic Acid (VITAMIN C) 500 MG CAPS Take 500 mg by mouth daily.     Historical Provider, MD  azithromycin (ZITHROMAX) 250 MG tablet Take as directed Patient not taking: Reported on 12/23/2016 11/23/15   Fritzi Mandes, MD  budesonide-formoterol [facility]) 160-4.5 MCG/ACT inhaler Inhale 2 puffs into the lungs 2 (two) times daily.     Historical Provider, MD  Calcium 500 MG tablet Take 500 mg by mouth daily.    Historical Provider, MD  cephALEXin (KEFLEX) 500 MG capsule Take 1 capsule (500 mg total) by mouth every 12 (twelve) hours. Patient not taking: Reported on 12/05/2016 11/23/15   Fritzi Mandes, MD  Cholecalciferol (VITAMIN D-1000 MAX ST) 1000 units tablet Take 1,000 Units by mouth daily.    Historical Provider, MD  Multiple Vitamin (MULTIVITAMIN WITH MINERALS) TABS tablet Take 1 tablet by mouth daily.    Historical Provider, MD  OXYGEN Inhale 2 L/min into the lungs continuous.  Historical Provider, MD  predniSONE (DELTASONE) 10 MG tablet Start with 50 mg daily taper by 10 mg daily thens top Patient not taking: Reported on 12/08/2016 11/23/15   Fritzi Mandes, MD  Pyridoxine HCl (B-6 PO) Take 1 tablet by mouth daily.    Historical Provider, MD  tiotropium (SPIRIVA) 18 MCG inhalation capsule Place 18 mcg into inhaler and inhale every other day.    Historical Provider, MD  vitamin B-12 (CYANOCOBALAMIN) 50 MCG tablet Take 50 mcg by mouth daily.    Historical Provider, MD  vitamin E 400 UNIT capsule  Take 400 Units by mouth daily.    Historical Provider, MD    Allergies Patient has no known allergies.  Family History  Problem Relation Age of Onset  . Hypertension Mother   . Coronary artery disease Father     Social History Social History  Substance Use Topics  . Smoking status: Former Research scientist (life sciences)  . Smokeless tobacco: Not on file  . Alcohol use No    Review of Systems Level V caveat secondary to altered mental status and unresponsive. ____________________________________________   PHYSICAL EXAM:  VITAL SIGNS: ED Triage Vitals  Enc Vitals Group     BP 12/03/2016 1137 (!) 153/79     Pulse Rate 12/27/2016 1137 80     Resp 12/05/2016 1200 20     Temp 12/12/2016 1218 (!) 93.8 F (34.3 C)     Temp src --      SpO2 12/21/2016 1248 (!) 20 %     Weight 12/19/2016 1216 140 lb (63.5 kg)     Height 12/06/16 1216 5\' 2"  (1.575 m)     Head Circumference --      Peak Flow --      Pain Score --      Pain Loc --      Pain Edu? --      Excl. in [place]? --     Constitutional: GCS of 3 with 5 mm pupils that are nonreactive bilaterally. Eyes: Conjunctivae are normal. PERRL. EOMI. Head: Atraumatic. Nose: No congestion/rhinnorhea. Mouth/Throat: Mucous membranes are moist.  Airway in place. Neck: No stridor.   Cardiovascular: Normal rate, regular rhythm. Grossly normal heart sounds.  Bounding pulses present in the carotids as well as at the groin Respiratory: With bagging the patient has coarse breath sounds bilaterally. Gastrointestinal: Soft No distention. Musculoskeletal: No lower extremity edema.  No joint effusions. Neurologic:  GCS of 3.  Skin:  Skin is warm, dry and intact. No rash noted. Psychiatric: Mood and affect are normal. Speech and behavior are normal.  ____________________________________________   LABS (all labs ordered are listed, but only abnormal results are displayed)  Labs Reviewed  CBC WITH DIFFERENTIAL/PLATELET - Abnormal; Notable for the following:       Result  Value   MCHC 31.1 (*)    RDW 14.6 (*)    Platelets 116 (*)    nRBC 3 (*)    All other components within normal limits  COMPREHENSIVE METABOLIC PANEL - Abnormal; Notable for the following:    Potassium 5.5 (*)    Chloride 100 (*)    BUN 58 (*)    Creatinine, Ser 2.10 (*)    Calcium >15.0 (*)    Total Protein 6.0 (*)    Albumin 2.5 (*)    AST 4,056 (*)    ALT 2,190 (*)    GFR calc non Af Amer 22 (*)    GFR calc Af Amer 25 (*)    Anion  gap 18 (*)    All other components within normal limits  LIPASE, BLOOD - Abnormal; Notable for the following:    Lipase <10 (*)    All other components within normal limits  TROPONIN I - Abnormal; Notable for the following:    Troponin I 0.06 (*)    All other components within normal limits  LACTIC ACID, PLASMA - Abnormal; Notable for the following:    Lactic Acid, Venous 12.9 (*)    All other components within normal limits  URINALYSIS, COMPLETE (UACMP) WITH MICROSCOPIC - Abnormal; Notable for the following:    Color, Urine AMBER (*)    APPearance CLOUDY (*)    Protein, ur 100 (*)    Squamous Epithelial / LPF 0-5 (*)    All other components within normal limits  BLOOD GAS, ARTERIAL - Abnormal; Notable for the following:    pH, Arterial 7.06 (*)    pCO2 arterial 63 (*)    pO2, Arterial 233 (*)    Bicarbonate 17.8 (*)    Acid-base deficit 12.9 (*)    All other components within normal limits  CULTURE, BLOOD (ROUTINE X 2)  CULTURE, BLOOD (ROUTINE X 2)  LACTIC ACID, PLASMA   ____________________________________________  EKG  ED ECG REPORT I, Doran Stabler, the attending physician, personally viewed and interpreted this ECG.   Date: 12/04/2016  EKG Time: 1136  Rate: 80  Rhythm: Wide-complex rhythm without definitive P waves.  Axis: indeterminate  Intervals:nonspecific intraventricular conduction delay  ST&T Change: No ST segment elevation or depression. No abnormal T-wave inversion. Very unclear baseline in leads 12 and 3. We  will repeat the EKG.  ED ECG REPORT I, Doran Stabler, the attending physician, personally viewed and interpreted this ECG.   Date: 12/20/2016  EKG Time: 1138  Rate: 60  Rhythm: Junctional rhythm  Axis: Normal axis  Intervals:nonspecific intraventricular conduction delay  ST&T Change: T-wave inversion in V3. Mild ST depression in V3. No ST elevation.   ____________________________________________  [place] Chest 1 View (Accession [id number]) (Order [id number])  Imaging  Date: 12/10/2016 Department: [facility] EMERGENCY DEPARTMENT Released By/Authorizing: Orbie Pyo, MD (auto-released)  Exam Information   Status Exam Begun  Exam Ended   Final [99] 12/06/2016 12:27 PM 12/06/2016 12:28 PM  PACS Images   Show images for DG Chest 1 View  Study Result   CLINICAL DATA:  Check endotracheal tube placement  EXAM: CHEST 1 VIEW  COMPARISON:  11/23/2015  FINDINGS: Cardiac shadow is within normal limits. Diffuse fibrotic changes are noted particularly throughout the right lung as well as the left lower lung. These changes have progressed in the interval from the prior exam. This may also represent some mild acute on chronic infiltrate. The endotracheal tube is noted in satisfactory position approximately 3.9 cm above the carina. Small bilateral pneumothoraces are noted of uncertain chronicity. No other focal abnormality is noted.  IMPRESSION: ET tube as described.  Small bilateral pleural effusions.  Diffuse chronic appearing fibrotic changes throughout the right lung as well as the left base. Possibility of acute on chronic infiltrate cannot be totally excluded.  Small bilateral pneumothoraces of uncertain chronicity.  Critical Value/emergent results were called by telephone at the time of interpretation on 12/26/2016 at 12:35 pm to Dr. Larae Grooms , who verbally acknowledged these results.   Electronically Signed    By: Inez Catalina M.D.   On: 12/19/2016 12:36     ____________________________________________   PROCEDURES  Procedure(s) performed:   INTUBATION Performed by: Doran Stabler  Required items: required blood products, implants, devices, and special equipment available Patient identity confirmed: provided demographic data and hospital-assigned identification number Time out: Immediately prior to procedure a "time out" was called to verify the correct patient, procedure, equipment, support staff and site/side marked as required.  Indications: Airway protection. Hypoxia.   Intubation method: Glidescope Laryngoscopy   Preoxygenation: BVM  Sedatives: none Paralytic: none  Tube Size: 7.5 cuffed  Post-procedure assessment: chest rise and ETCO2 monitor Breath sounds: equal and absent over the epigastrium Tube secured with: ETT holder Chest x-ray interpreted by radiologist and me.  Chest x-ray findings: endotracheal tube in appropriate position.  Bilateral pneumothoraces.    Patient tolerated the procedure well with no immediate complications.     Procedures  Critical Care performed:  CRITICAL CARE Performed by: Doran Stabler   Total critical care time: 45 minutes  Critical care time was exclusive of separately billable procedures and treating other patients.  Critical care was necessary to treat or prevent imminent or life-threatening deterioration.  Critical care was time spent personally by me on the following activities: development of treatment plan with patient and/or surrogate as well as nursing, discussions with consultants, evaluation of patient's response to treatment, examination of patient, obtaining history from patient or surrogate, ordering and performing treatments and interventions, ordering and review of laboratory studies, ordering and review of radiographic studies, pulse oximetry and re-evaluation of patient's  condition.  ____________________________________________   INITIAL IMPRESSION / ASSESSMENT AND PLAN / ED COURSE  Pertinent labs & imaging results that were available during my care of the patient were reviewed by me and considered in my medical decision making (see chart for details).  ----------------------------------------- 1:09 PM on 12/05/2016 -----------------------------------------  Patient intubated with oxygen saturation that read at its highest 100%. However occasionally it is still reading in the 70s and 80s. ABG obtained with by mouth 2 above 200. CO2 above 60. Patient being treated for COPD exacerbation with sepsis and pneumonia. Chest tubes bilaterally placed by critical care. Patient able to be reduced on her dopamine dose. However is still on dopamine and Levophed at this time.  Patient to be dispositioned to the ICU. Discussed the case also with the patient's son, Shonnie Poudrier, who is not sure if the patient has an advanced started or not and would like [place] to be aggressive with the patient's care until he as well as other family arrive.      Patient also with renal failure with mild potassium elevation. Liver failure. Elevated lactic acid 12.9. Feel that these changes are likely related to the patient's shock state.  Patient to be dispositioned critical care. Dr. Jamal Collin as well as Dr. Ashby Dawes are in the emergency department.  ____________________________________________   FINAL CLINICAL IMPRESSION(S) / ED DIAGNOSES  Respiratory arrest. Cardiac arrest. Bilateral pneumothorax. Shock. Sepsis. Renal failure. Hyperkalemia. Shock liver.    NEW MEDICATIONS STARTED DURING THIS VISIT:  New Prescriptions   No medications on file     Note:  This document was prepared using Dragon voice recognition software and may include unintentional dictation errors.    Orbie Pyo, MD 12/22/2016 1314  Also patient with right intraosseous access to the right tibia  that is firmly in place.    Orbie Pyo, MD 12/12/2016 1314

## 2016-12-06 NOTE — Progress Notes (Signed)
Pharmacy Antibiotic Note  Autumn Moore is a 76 y.o. female  With a h/o stage IV COPD admitted on 12/25/2016 s/p cardiac arrest. Patient is a code ICE.  Pharmacy has been consulted for vancomycin and cefepime dosing.  Plan: Vancomycin 1000 mg iv once then 750 mg iv q 36 hours with stacked dosing. Will check levels/adjust dosing as clinically indicated to maintain a trough of 15-20 mcg/ml.   Cefepime 2 g iv once then 1 g iv q 24 hours.    Height: 5\' 2"  (157.5 cm) Weight: 140 lb (63.5 kg) IBW/kg (Calculated) : 50.1  Temp (24hrs), Avg:93.9 F (34.4 C), Min:93.3 F (34.1 C), Max:95 F (35 C)   Recent Labs Lab 12/11/2016 1151  WBC 7.9  CREATININE 2.10*  LATICACIDVEN 12.9*    Estimated Creatinine Clearance: 19.7 mL/min (by C-G formula based on SCr of 2.1 mg/dL (H)).    No Known Allergies  Antimicrobials this admission: Cefepime 3/8 >>  vancomycin 3/8 >>   Dose adjustments this admission:   Microbiology results: 3/8 BCx: pending 3/8 MRSA PCR: pending  Thank you for allowing pharmacy to be a part of this patient's care.  Ulice Dash D 12/18/2016 4:09 PM

## 2016-12-06 NOTE — ED Notes (Signed)
Per Dr. Lucita Lora abx given due to inability to obtain blood cultures at this time

## 2016-12-06 NOTE — H&P (Signed)
PULMONARY / CRITICAL CARE MEDICINE   Name: DIANTHA PAXSON MRN: [id number] DOB: Nov 19, 1940    ADMISSION DATE:  12/15/2016 CONSULTATION DATE:  003/24/2018  REFERRING MD: Dr. 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  She presented to [facility] ER 03/8 following respiratory and cardiac arrest.  Per ER notes, EMS was notified due to pt having respiratory distress it is unclear who notified EMS.  Upon EMS arrival the pt was apneic and pulseless with an initial rhythm of asystole, therefore CPR was initiated by EMS with ROSC following 22 mins of CPR. Pt returned to NSR, BP 81/52, and HR 84.  It is estimated possible total downtime was 1 hr prior to EMS arrival.  In route to the ER pt lost her pulse again cardiac rhythm vfibb on monitor, therefore CPR initiated by EMS with ROSC 2-3 mins cardiac rhythm returned to NSR.  She received a total of 7 epi and 1 atropine total by EMS during codes.  Upon arrival to the ER she was subsequently mechanically intubated, received 1 gram calcium, and code sepsis was initiated.  CXR revealed bilateral pneumothorax, therefore PCCM emergently placed bilateral chest tubes.  PCCM contacted to admit pt to ICU for acute on chronic respiratory failure secondary to AECOPD and bilateral pneumothorax, cardiac arrest secondary to respiratory arrest, hypotension requiring vasopressors, lactic acidosis, mixed respiratory and metabolic acidosis, hyperkalemia, elevated liver enzymes, and acute renal failure.   PAST MEDICAL HISTORY :  She  has a past medical history of CAD (coronary artery disease); COPD (chronic obstructive pulmonary disease) ([place]); and Hypertension.  PAST SURGICAL HISTORY: She  has a past surgical history that includes Colonoscopy with propofol (N/A, 03/07/2015) and  Esophagogastroduodenoscopy (N/A, 03/07/2015).  No Known Allergies  No current facility-administered medications on file prior to encounter.    Current Outpatient Prescriptions on File Prior to Encounter  Medication Sig  . albuterol (PROVENTIL HFA;VENTOLIN HFA) 108 (90 BASE) MCG/ACT inhaler Inhale 2 puffs into the lungs every 6 (six) hours as needed for wheezing or shortness of breath.  Marland Kitchen amLODipine (NORVASC) 2.5 MG tablet Take 2.5 mg by mouth daily.  Marland Kitchen aspirin 81 MG tablet Take 1 tablet (81 mg total) by mouth every other day.  . carvedilol (COREG) 6.25 MG tablet Take 6.25 mg by mouth 2 (two) times daily with a meal.  . omeprazole (PRILOSEC) 20 MG capsule Take 20 mg by mouth every other day.  . Ascorbic Acid (VITAMIN C) 500 MG CAPS Take 500 mg by mouth daily.   Marland Kitchen azithromycin (ZITHROMAX) 250 MG tablet Take as directed (Patient not taking: Reported on 12/04/2016)  . budesonide-formoterol (SYMBICORT) 160-4.5 MCG/ACT inhaler Inhale 2 puffs into the lungs 2 (two) times daily.   . Calcium 500 MG tablet Take 500 mg by mouth daily.  . cephALEXin (KEFLEX) 500 MG capsule Take 1 capsule (500 mg total) by mouth every 12 (twelve) hours. (Patient not taking: Reported on 12/12/2016)  . Cholecalciferol (VITAMIN D-1000 MAX ST) 1000 units tablet Take 1,000 Units by mouth daily.  . Multiple Vitamin (MULTIVITAMIN WITH MINERALS) TABS tablet Take 1 tablet by mouth daily.  . OXYGEN Inhale 2 L/min into the lungs continuous.   . predniSONE (DELTASONE) 10 MG tablet Start with 50 mg daily taper by 10 mg daily thens top (Patient not taking: Reported on 12/24/2016)  .  Pyridoxine HCl (B-6 PO) Take 1 tablet by mouth daily.  Marland Kitchen tiotropium (SPIRIVA) 18 MCG inhalation capsule Place 18 mcg into inhaler and inhale every other day.  . vitamin B-12 (CYANOCOBALAMIN) 50 MCG tablet Take 50 mcg by mouth daily.  . vitamin E 400 UNIT capsule Take 400 Units by mouth daily.    FAMILY HISTORY:  Her has no family status information on file.     SOCIAL HISTORY: She  reports that she has quit smoking. She does not have any smokeless tobacco history on file. She reports that she does not drink alcohol.  REVIEW OF SYSTEMS:   Unable to assess pt intubated   SUBJECTIVE:  Unable to assess pt intubated   VITAL SIGNS: BP (!) 131/48   Pulse 96   Temp (!) 93.9 F (34.4 C)   Resp 19   Ht 5' 2"  (1.575 m)   Wt 63.5 kg (140 lb)   SpO2 (!) 20%   BMI 25.61 kg/m   HEMODYNAMICS:    VENTILATOR SETTINGS: Vent Mode: AC FiO2 (%):  [100 %] 100 % Set Rate:  [14 bmp] 14 bmp Vt Set:  [450 mL] 450 mL PEEP:  [5 cmH20] 5 cmH20  INTAKE / OUTPUT: No intake/output data recorded.  PHYSICAL EXAMINATION: General: acutely ill Caucasian elderly female  Neuro: does not follow commands  HEENT: supple, no JVD Cardiovascular: sinus tach with PVC's, s1s2, no M/R/G Lungs: rhonchi throughout, even, non labored mechanically intubated, bilateral chest tubes to gravity dressings dry and intact  Abdomen: hypoactive BS x4, soft, non tender, non distended  Musculoskeletal: normal bulk and tone, no edema  Skin: bilateral lateral chest tube incision sites dressing dry and intact, scattered ecchymosis   LABS:  BMET  Recent Labs Lab 12/20/2016 1151  NA 140  K 5.5*  CL 100*  CO2 22  BUN 58*  CREATININE 2.10*  GLUCOSE 66    Electrolytes  Recent Labs Lab 12/29/2016 1151  CALCIUM >15.0*    CBC  Recent Labs Lab 12/04/2016 1151  WBC 7.9  HGB 12.7  HCT 41.0  PLT 116*    Coag's No results for input(s): APTT, INR in the last 168 hours.  Sepsis Markers  Recent Labs Lab 12/10/2016 1151  LATICACIDVEN 12.9*    ABG  Recent Labs Lab 12/26/2016 1213  PHART 7.06*  PCO2ART 63*  PO2ART 233*    Liver Enzymes  Recent Labs Lab 11/29/2016 1151  AST 4,056*  ALT 2,190*  ALKPHOS 105  BILITOT 0.8  ALBUMIN 2.5*    Cardiac Enzymes  Recent Labs Lab 12/21/2016 1151  TROPONINI 0.06*    Glucose No results for input(s): GLUCAP in  the last 168 hours.  Imaging Dg Chest 1 View  Result Date: 12/05/2016 CLINICAL DATA:  Check endotracheal tube placement EXAM: CHEST 1 VIEW COMPARISON:  11/23/2015 FINDINGS: Cardiac shadow is within normal limits. Diffuse fibrotic changes are noted particularly throughout the right lung as well as the left lower lung. These changes have progressed in the interval from the prior exam. This may also represent some mild acute on chronic infiltrate. The endotracheal tube is noted in satisfactory position approximately 3.9 cm above the carina. Small bilateral pneumothoraces are noted of uncertain chronicity. No other focal abnormality is noted. IMPRESSION: ET tube as described. Small bilateral pleural effusions. Diffuse chronic appearing fibrotic changes throughout the right lung as well as the left base. Possibility of acute on chronic infiltrate cannot be totally excluded. Small bilateral pneumothoraces of uncertain chronicity. Critical Value/emergent results were  called by telephone at the time of interpretation on 11/30/2016 at 12:35 pm to Dr. Larae Grooms , who verbally acknowledged these results. Electronically Signed   By: Inez Catalina M.D.   On: 11/29/2016 12:36   STUDIES:  CXR 03/8>>Small bilateral pleural effusions. Diffuse chronic appearing fibrotic changes throughout the right lung as well as the left base. Possibility of acute on chronic infiltrate cannot be totally excluded. Small bilateral pneumothoraces of uncertain chronicity.  CULTURES: Blood x2>>   ANTIBIOTICS: Cefepime 03/8>> Vancomycin 03/8>>  SIGNIFICANT EVENTS: 03/8-Pt admitted with acute on chronic respiratory failure secondary to AECOPD, and bilateral pneumothorax requiring mechanical intubation, cardiac arrest secondary to respiratory arrest, hypotension requiring vasopressors, lactic acidosis, mixed respiratory and metabolic acidosis, hyperkalemia, elevated liver enzymes, and acute renal failure.   LINES/TUBES: Bilateral 24  Fr. Chest Tubes x2 03/8>> ETT 03/8>>  ASSESSMENT / PLAN:  PULMONARY A: Acute on chronic respiratory failure secondary to AECOPD and bilateral pneumothorax  Mechanical Intubation Bilateral Chest Tubes  P:   Hypothermic protocol @ 36 degrees Celsius  Full vent support wean when parameters met  Continue bilateral chest tubes to gravity Repeat ABG and CXR today 03/8 Aggressive bronchodilator therapy  IV steroids   CARDIOVASCULAR A:  Vfibb/Asystole Cardiac Arrest secondary likely secondary to respiratory arrest  Hypotension  Hx: HTN and CAD P:  Continuous telemetry monitoring Maintain map >65, prn levophed to maintain map goal  Continue aspirin  Hold outpatient amlodipine and carvedilol  Trend troponin's Echo pending  Cardiology consulted appreciate input  EKG per hypothermic protocol   RENAL A:   Acute renal failure likely secondary to hypotension Hyperkalemia  Lactic acidosis  Metabolic Acidosis  P:   Repeat BMP and ABG Trend lactic acid Replace electrolytes as indicated Monitor UOP  GASTROINTESTINAL A:   Elevated liver enzymes P:   Repeat CMP in am  Pepcid for PUD prophylaxis  Keep NPO for now   HEMATOLOGIC A:   No acute issues  P:  Subq heparin for VTE prophylaxis  Trend CBC Monitor for s/sx of bleeding Transfuse for hgb <7  INFECTIOUS A:   ?Pneumonia  P:   Trend WBC and monitor fever curve Trend lactic acid and PCT's Continue abx as listed above Follow cultures   ENDOCRINE A:   No acute issues  P:   CBG's q4hrs  NEUROLOGIC A:   Acute encephalopathy secondary to anoxia  P:   RASS goal: -1 during cooling phase of hypothermic protocol  Fentanyl gtt, prn versed and fentanyl to maintain RASS goal CT Head and WUA once rewarming phase of hypothermic protocol completed Neurology consulted appreciate input  EEG pending   FAMILY  - Updates: No family at bedside to update they are currently traveling from [place], [place] 12/06/2016  -  Inter-disciplinary family meet or Palliative Care meeting due by:  12/13/2016  Marda Stalker, [place] Pager [phone] (please enter 7 digits) PCCM Consult Pager [phone] (please enter 7 digits)  PCCM ATTENDING: I evaluated pt with AGNP [place]ak[place] on day of admission and agree with the above findings, assessment and plan as documented.  On this day, she remains comatose and on high level of vent support and vasopressors. She is oligo-anuric with worsening renal indices. I spoke with family to discuss prognosis and they were already leaning towards withdrawal of support and comfort care only. 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 I have indicated that there is little realistic hope  that pt can recover in a favorable way. They inform me that she has previously stated to her son (who is not present today) that she wished to be DNR under all circumstances. Therefore, we will proceed with discontinuation of life sustaining therapies and full comfort care  CCM time: 60 mins The above time includes time spent in consultation with patient and/or family members and reviewing care plan on multidisciplinary rounds  Merton Border, MD PCCM service Mobile [phone] Pager [phone]

## 2016-12-06 NOTE — ED Notes (Signed)
No CT per Dr. Lucita Lora

## 2016-12-06 NOTE — Procedures (Signed)
RIGHT CHEST TUBE INSERTION INDICATION: tension pneumothorax after cardiac arrest and ACLS PROCEDURE: This procedure was performed emergently. Consent could not be obtained due to the emergent nature. The right lateral chest wall was sterilized with chlorhexidine, then draped in a sterile manner. The chest tube tray was used for insertion. The pleural space was easily accessed with the insertion needle. An air rush was encountered. The guidewire was placed through the introducer needle and the needle was removed. A small incision was made alongside the guidewire. Progressive dilation was performed with the dilators. A 24 French chest tube was placed over the wire and secured at approximately 15 cm. A follow-up chest x-ray is pending.  Merton Border, MD PCCM service Mobile [phone] Pager [phone] 12/29/2016

## 2016-12-06 NOTE — Procedures (Signed)
LEFT CHEST TUBE INSERTION INDICATION: tension pneumothorax after cardiac arrest and ACLS PROCEDURE: This procedure was performed emergently. Consent could not be obtained due to the emergent nature. The left lateral chest wall was sterilized with chlorhexidine, then draped in a sterile manner. The chest tube tray was used for insertion. The pleural space was easily accessed with the insertion needle. An air rush was encountered. The guidewire was placed through the introducer needle and the needle was removed. A small incision was made alongside the guidewire. Progressive dilation was performed with the dilators. A 24 French chest tube was placed over the wire and secured at approximately 15 cm. A follow-up chest x-ray is pending.  Merton Border, MD PCCM service Mobile [phone] Pager [phone] 12/01/2016

## 2016-12-06 NOTE — Progress Notes (Signed)
Updated family on plan of care at bedside, confirmed full code status, continue current measures.  Marda Stalker, M.D.  11/30/2016

## 2016-12-06 NOTE — Consult Note (Signed)
[facility] Consultation Note  Patient ID: Autumn Moore, MRN: [id number], DOB/AGE: August 03, 1941 76 y.o. Admit date: 12/05/2016   Date of Consult: 12/13/2016 Primary Physician: Maryland Pink, MD Primary Cardiologist:None  Chief Complaint:  Chief Complaint  Patient presents with  . Cardiac Arrest   Reason for Consult: acute arrest  HPI: 76 y.o. female with apparent coronary artery disease in the past with chronic obstructive pulmonary disease on appropriate medication management who was seen this morning before family members left the house. The patient has had some mild shortness of breath weakness and fatigue over the last several weeks of unknown etiology with possible cough. There was not any clear evidence of anginal symptoms at this time. The patient's family members saw her unresponsive in the house at 85 AM of unknown etiology and unknown time being down. When EMS arrived she had apparent pulseless electrical activity and started CPR. At that time apparently she had V. fib as one of her rhythms and may continued ACLS protocol. The patient then did respond although had bilateral pneumothoraces due to her ACLS protocol. She since then has had intubation and oxygenation and his slowly giving some responsiveness. EKG at that time showed the sinus bradycardia with wide complex and now is now sinus tachycardia with more narrow complex. The patient has had a troponin level which is essentially normal at this time suggesting no evidence of myocardial infarction at this time. There has been occasional preventricular contractions but no current evidence of congestive heart failure either. At this time it's of unknown etiology but appears not to be acute myocardial infarction requiring further acute intervention from the cardiac standpoint  Past Medical History:  Diagnosis Date  . CAD (coronary artery disease)   . COPD (chronic obstructive pulmonary disease) ([place])   . Hypertension        Surgical History:  Past Surgical History:  Procedure Laterality Date  . COLONOSCOPY WITH PROPOFOL N/A 03/07/2015   Procedure: COLONOSCOPY WITH PROPOFOL;  Surgeon: Hulen Luster, MD;  [facility] END[facility]COPY;  Service: Gastroenterology;  Laterality: N/A;  . ESOPHAGOGASTRODUODENOSCOPY N/A 03/07/2015   Procedure: ESOPHAGOGASTRODUODENOSCOPY (EGD);  Surgeon: Hulen Luster, MD;  Location: [facility] ENDOSCOPY;  Service: Gastroenterology;  Laterality: N/A;     Home Meds: Prior to Admission medications   Medication Sig Start Date End Date Taking? Authorizing Provider  albuterol (PROVENTIL HFA;VENTOLIN HFA) 108 (90 BASE) MCG/ACT inhaler Inhale 2 puffs into the lungs every 6 (six) hours as needed for wheezing or shortness of breath.   Yes Historical Provider, MD  amLODipine (NORVASC) 2.5 MG tablet Take 2.5 mg by mouth daily.   Yes Historical Provider, MD  aspirin 81 MG tablet Take 1 tablet (81 mg total) by mouth every other day. 11/23/15  Yes Fritzi Mandes, MD  carvedilol (COREG) 6.25 MG tablet Take 6.25 mg by mouth 2 (two) times daily with a meal.   Yes Historical Provider, MD  omeprazole (PRILOSEC) 20 MG capsule Take 20 mg by mouth every other day.   Yes Historical Provider, MD  Ascorbic Acid (VITAMIN C) 500 MG CAPS Take 500 mg by mouth daily.     Historical Provider, MD  azithromycin (ZITHROMAX) 250 MG tablet Take as directed Patient not taking: Reported on 12/03/2016 11/23/15   Fritzi Mandes, MD  budesonide-formoterol [facility]) 160-4.5 MCG/ACT inhaler Inhale 2 puffs into the lungs 2 (two) times daily.     Historical Provider, MD  Calcium 500 MG tablet Take 500 mg by mouth daily.  Historical Provider, MD  cephALEXin (KEFLEX) 500 MG capsule Take 1 capsule (500 mg total) by mouth every 12 (twelve) hours. Patient not taking: Reported on 12/20/2016 11/23/15   Fritzi Mandes, MD  Cholecalciferol (VITAMIN D-1000 MAX ST) 1000 units tablet Take 1,000 Units by mouth daily.    Historical Provider, MD  Multiple Vitamin (MULTIVITAMIN  WITH MINERALS) TABS tablet Take 1 tablet by mouth daily.    Historical Provider, MD  OXYGEN Inhale 2 L/min into the lungs continuous.     Historical Provider, MD  predniSONE (DELTASONE) 10 MG tablet Start with 50 mg daily taper by 10 mg daily thens top Patient not taking: Reported on 12/26/2016 11/23/15   Fritzi Mandes, MD  Pyridoxine HCl (B-6 PO) Take 1 tablet by mouth daily.    Historical Provider, MD  tiotropium (SPIRIVA) 18 MCG inhalation capsule Place 18 mcg into inhaler and inhale every other day.    Historical Provider, MD  vitamin B-12 (CYANOCOBALAMIN) 50 MCG tablet Take 50 mcg by mouth daily.    Historical Provider, MD  vitamin E 400 UNIT capsule Take 400 Units by mouth daily.    Historical Provider, MD    Inpatient Medications:  . amiodarone  150 mg Intravenous Once  . budesonide (PULMICORT) nebulizer solution  0.25 mg Nebulization BID  . [START ON 12-19-16] ceFEPime (MAXIPIME) IV  1 g Intravenous Q24H  . ceFEPIme      . famotidine (PEPCID) IV  20 mg Intravenous Q2000  . fentaNYL (SUBLIMAZE) injection  50 mcg Intravenous Once  . heparin  5,000 Units Subcutaneous Q8H  . ipratropium-albuterol  3 mL Nebulization Q4H  . levETIRAcetam  1,000 mg Intravenous Once  . [START ON 12-19-16] methylPREDNISolone (SOLU-MEDROL) injection  40 mg Intravenous Q12H  . norepinephrine (LEVOPHED) Adult infusion  0-40 mcg/min Intravenous Once  . [START ON 2016-12-19] vancomycin  750 mg Intravenous Q36H   . sodium chloride 75 mL/hr at 12/29/2016 1645  . amiodarone     Followed by  . [START ON 12/19/16] amiodarone    . fentaNYL    . norepinephrine (LEVOPHED) Adult infusion 40 mcg/min (12/24/2016 1746)  . norepinephrine (LEVOPHED) Adult infusion    . vasopressin (PITRESSIN) infusion - *FOR SHOCK* 0.03 Units/min (12/21/2016 1736)    Allergies: No Known Allergies  Social History   Social History  . Marital status: Widowed    Spouse name: N/A  . Number of children: N/A  . Years of education: N/A    Occupational History  . Not on file.   Social History Main Topics  . Smoking status: Former Research scientist (life sciences)  . Smokeless tobacco: Not on file  . Alcohol use No  . Drug use: Unknown  . Sexual activity: Not on file   Other Topics Concern  . Not on file   Social History Narrative  . No narrative on file     Family History  Problem Relation Age of Onset  . Hypertension Mother   . Coronary artery disease Father      Review of Systems  Cannot be assessed Labs:  Recent Labs  12/20/2016 1151 12/05/2016 1340  TROPONINI 0.06* 0.07*   Lab Results  Component Value Date   WBC 7.9 12/16/2016   HGB 12.7 12/14/2016   HCT 41.0 12/05/2016   MCV 97.2 12/29/2016   PLT 116 (L) 12/03/2016    Recent Labs Lab 12/21/2016 1151  NA 140  K 5.5*  CL 100*  CO2 22  BUN 58*  CREATININE 2.10*  CALCIUM >15.0*  PROT  6.0*  BILITOT 0.8  ALKPHOS 105  ALT 2,190*  AST 4,056*  GLUCOSE 66   No results found for: CHOL, HDL, LDLCALC, TRIG No results found for: DDIMER  Radiology/Studies:  Dg Chest 1 View  Result Date: 12/18/2016 CLINICAL DATA:  Chest tube insertion, former smoking history EXAM: CHEST 1 VIEW COMPARISON:  Chest x-ray of 12/04/2016 FINDINGS: Bilateral chest tubes are present and no pneumothorax is evident. Diffuse interstitial and airspace opacities remain throughout the lungs. Tip of endotracheal tube is approximately 1.9 cm above the carina. NG tube extends below the hemidiaphragm. IMPRESSION: 1. Bilateral chest tubes are now present with no evidence of residual pneumothorax. 2. Diffuse interstitial and alveolar opacities remain throughout the lungs. Electronically Signed   By: Ivar Drape M.D.   On: 12/24/2016 16:42   Dg Chest 1 View  Result Date: 12/22/2016 CLINICAL DATA:  Check endotracheal tube placement EXAM: CHEST 1 VIEW COMPARISON:  11/23/2015 FINDINGS: Cardiac shadow is within normal limits. Diffuse fibrotic changes are noted particularly throughout the right lung as well as the left  lower lung. These changes have progressed in the interval from the prior exam. This may also represent some mild acute on chronic infiltrate. The endotracheal tube is noted in satisfactory position approximately 3.9 cm above the carina. Small bilateral pneumothoraces are noted of uncertain chronicity. No other focal abnormality is noted. IMPRESSION: ET tube as described. Small bilateral pleural effusions. Diffuse chronic appearing fibrotic changes throughout the right lung as well as the left base. Possibility of acute on chronic infiltrate cannot be totally excluded. Small bilateral pneumothoraces of uncertain chronicity. Critical Value/emergent results were called by telephone at the time of interpretation on 12/28/2016 at 12:35 pm to Dr. Larae Grooms , who verbally acknowledged these results. Electronically Signed   By: Inez Catalina M.D.   On: 12/22/2016 12:36   Dg Abd 1 View  Result Date: 12/01/2016 CLINICAL DATA:  Evaluate OG tube placement EXAM: ABDOMEN - 1 VIEW COMPARISON:  Portable chest x-ray of 12/02/2016 FINDINGS: The tip of the OG tube extends in to the region of the distal antrum of the stomach. The bowel gas pattern is nonspecific. IMPRESSION: The tip of the OG tube is in the region of the distal antrum of the stomach. Electronically Signed   By: Ivar Drape M.D.   On: 12/12/2016 16:44    EKG: Sinus bradycardia now normal sinus rhythm  Weights: Filed Weights   11/30/2016 1216  Weight: 63.5 kg (140 lb)     Physical Exam: Blood pressure (!) 97/50, pulse (!) 109, temperature (!) 95 F (35 C), temperature source Other (Comment), resp. rate (!) 22, height 5\' 2"  (1.575 m), weight 63.5 kg (140 lb), SpO2 95 %. Body mass index is 25.61 kg/m. General: Well developed, well nourished,And intubated  Head eyes ears nose throat: Normocephalic, atraumatic, sclera non-icteric, no xanthomas, nares are without discharge. No apparent thyromegaly and/or mass  Lungs: Intubated on a respirator.  no  wheezes, no rales, no rhonchi.  Heart: RRR with normal S1 S2. no murmur gallop, no rub, PMI is normal size and placement, carotid upstroke normal without bruit, jugular venous pressure is normal Abdomen: Soft, non-tender, non-distended with normoactive bowel sounds. No hepatomegaly. No rebound/guarding. No obvious abdominal masses. Abdominal aorta is normal size without bruit Extremities: Trace edema. no cyanosis, no clubbing, no ulcers  Peripheral : 2+ bilateral upper extremity pulses, 2+ bilateral femoral pulses, 2+ bilateral dorsal pedal pulse Neuro: Not Alert and oriented. No facial asymmetry. No focal deficit.  Moves all extremities spontaneously. Musculoskeletal: Normal muscle tone without kyphosis Psych:  Does not Responds to questions appropriately with a normal affect.    Assessment: 76 year old female with history of coronary artery disease chronic obstructive pulmonary disease essential hypertension mixed hyperlipidemia with an acute arrest of unknown etiology and currently does not appear to be from myocardial infarction. Rhythm disturbances appear to be initially pulseless electrical activity possibly secondary to hypoxia  Plan: 1. Continue supportive care for acute arrest with hypoxia and pulseless electrical activity now improved with normal sinus rhythm 2. Continue observation and further evaluation including echocardiogram for assessment of LV systolic dysfunction congestive heart failure valvular heart disease contributing to above 3. Serial ECG and enzymes to assess for possibility of coronary artery disease as the primary cause of above which may require further cardiac catheterization but would defer at this time until further recovery from primary incident 4. No additional medication management necessary at this time  Signed, Corey Skains M.D. [facility] 12/09/2016, 6:05 PM

## 2016-12-06 NOTE — ED Notes (Signed)
Dr. Rosita Fire at bedside for chest tube placement

## 2016-12-06 NOTE — Progress Notes (Signed)
Marda Stalker, NP gave order for amio drip with amio bolus for sustained SVT.

## 2016-12-06 NOTE — ED Notes (Signed)
Dr. Rosita Fire notified of critical calcium > 15

## 2016-12-06 NOTE — Progress Notes (Signed)
SVT sustained, Dana NP at bedside to place central line.

## 2016-12-06 NOTE — Progress Notes (Signed)
Patient continues to have seizure like activity intermittently with twitches.  [place], NP at bedside and assessed patient and is to order IV ativan for seizure activity.  NSR with frequent PVC's per cardiac monitor.  Blood pressure supported with levophed and vasopressin drips.  Only 3 cc of urine output since admission.  Target temperature met at 1535 this afternoon.  Report given to [facility], RN who is now taking over patient's care. Rewarming should begin at 1535 on 01/02/2017.

## 2016-12-06 NOTE — Progress Notes (Signed)
This RN at bedside with Marda Stalker, NP and patient noted to have seizure like activity with rapid blinking of eyes.  NP stated she will order keppra IVPB.

## 2016-12-06 NOTE — ED Notes (Signed)
CODE SEPSIS CALLED TO DOUG AT [facility] 

## 2016-12-06 NOTE — Progress Notes (Signed)
Patient having run of SVT rate in 160's and Hinton Dyer, NP aware and gave order to stop Dopamine drip.

## 2016-12-06 NOTE — Progress Notes (Addendum)
Patient's cardiac rhythm in SVT and Marda Stalker, NP aware and gave order to stop dopamine drip.  Dopamine drip stopped. Patient converted back into NSR with PVCs within approximately a minute. Will continue to monitor.

## 2016-12-06 NOTE — ED Triage Notes (Addendum)
Pt arrives via EMS from home, EMS was called for resp distress, upon their arrival pt was apneic and pulseless, CPR was started by EMS, EMS reports CPR was started at 1038, asystole was initial rhythm, 22 mins of CPR was preformed and pt returned to NSR, BP 81/52, HR 84, in route EMS reports pulses were lost again, Vfib on monitor, after 2-3 mins of CPR pt returned to NSR, EMS reports 7 of epi given and 1 of atropine total given, upon arrival to ER pt has pulse, EDP at bedside, pt being bagged upon arrival

## 2016-12-06 NOTE — Progress Notes (Signed)
Pt went into SVT, then afib with RVR.  Per Hinton Dyer, NP, give amiodarone bolus.  Pts HR decreased into the 60's.  Amiodarone drip was put on hold.  HR increased again into the 160s.  Per Hinton Dyer, NP restart amiodarone drip at 16.53ml/hr and monitor HR..  HR again decreased into the 60s, amiodarone drip placed on hold.  Per Hinton Dyer, NP, titrate off levophed and start neo drip.

## 2016-12-06 NOTE — Progress Notes (Signed)
Patient no longer in SVT and rate in 60's NSR with PVCs. Hinton Dyer, NP at bedside and gave order to hold off on starting amiodarone bolus and drip.

## 2016-12-06 NOTE — ED Notes (Signed)
Dr. Lucita Lora made aware of pts positioning in bed and posturing

## 2016-12-06 NOTE — Progress Notes (Signed)
Responded to page due to cardiac arrest.  No family present.  Notified  that patient's family is in route from [place].  Will provided follow-up care to patient and family as needed.

## 2016-12-07 ENCOUNTER — Inpatient Hospital Stay (HOSPITAL_COMMUNITY): Payer: Medicare Other

## 2016-12-07 ENCOUNTER — Inpatient Hospital Stay
Admit: 2016-12-07 | Discharge: 2016-12-07 | Disposition: A | Discharge status: Home / Self Care | Payer: Medicare Other | Attending: Critical Care Medicine | Admitting: Critical Care Medicine

## 2016-12-07 ENCOUNTER — Inpatient Hospital Stay: Payer: Medicare Other

## 2016-12-07 DIAGNOSIS — J9600 Acute respiratory failure, unspecified whether with hypoxia or hypercapnia: Secondary | ICD-10-CM

## 2016-12-07 LAB — COMPREHENSIVE METABOLIC PANEL
ALT: 4588 U/L — AB (ref 14–54)
AST: 11943 U/L — AB (ref 15–41)
Albumin: 2.4 g/dL — ABNORMAL LOW (ref 3.5–5.0)
Alkaline Phosphatase: 124 U/L (ref 38–126)
Anion gap: 11 (ref 5–15)
BUN: 75 mg/dL — AB (ref 6–20)
CHLORIDE: 101 mmol/L (ref 101–111)
CO2: 25 mmol/L (ref 22–32)
CREATININE: 2.49 mg/dL — AB (ref 0.44–1.00)
Calcium: 7.5 mg/dL — ABNORMAL LOW (ref 8.9–10.3)
GFR calc non Af Amer: 18 mL/min — ABNORMAL LOW (ref >60)
GFR, EST AFRICAN AMERICAN: 20 mL/min — AB (ref >60)
Glucose, Bld: 235 mg/dL — ABNORMAL HIGH (ref 65–99)
Potassium: 4.5 mmol/L (ref 3.5–5.1)
SODIUM: 137 mmol/L (ref 135–145)
Total Bilirubin: 2.8 mg/dL — ABNORMAL HIGH (ref 0.3–1.2)
Total Protein: 5.4 g/dL — ABNORMAL LOW (ref 6.5–8.1)

## 2016-12-07 LAB — BLOOD GAS, ARTERIAL
Acid-base deficit: 3.4 mmol/L — ABNORMAL HIGH (ref 0.0–2.0)
BICARBONATE: 24.2 mmol/L (ref 20.0–28.0)
FIO2: 100
LHR: 22 {breaths}/min
O2 Saturation: 97.1 %
PEEP: 5 cmH2O
Patient temperature: 36
VT: 450 mL
pCO2 arterial: 54 mmHg — ABNORMAL HIGH (ref 32.0–48.0)
pH, Arterial: 7.27 — ABNORMAL LOW (ref 7.350–7.450)
pO2, Arterial: 98 mmHg (ref 83.0–108.0)

## 2016-12-07 LAB — ECHOCARDIOGRAM COMPLETE
Height: 62 in
Weight: 2240 oz

## 2016-12-07 LAB — TROPONIN I: Troponin I: 0.64 ng/mL (ref <0.03)

## 2016-12-07 LAB — CBC
HEMATOCRIT: 34.9 % — AB (ref 35.0–47.0)
HEMOGLOBIN: 11.3 g/dL — AB (ref 12.0–16.0)
MCH: 29.6 pg (ref 26.0–34.0)
MCHC: 32.4 g/dL (ref 32.0–36.0)
MCV: 91.2 fL (ref 80.0–100.0)
Platelets: 158 10*3/uL (ref 150–440)
RBC: 3.82 MIL/uL (ref 3.80–5.20)
RDW: 14 % (ref 11.5–14.5)
WBC: 14.4 10*3/uL — ABNORMAL HIGH (ref 3.6–11.0)

## 2016-12-07 LAB — AMMONIA: AMMONIA: 46 umol/L — AB (ref 9–35)

## 2016-12-07 LAB — GLUCOSE, CAPILLARY
GLUCOSE-CAPILLARY: 180 mg/dL — AB (ref 65–99)
Glucose-Capillary: 230 mg/dL — ABNORMAL HIGH (ref 65–99)
Glucose-Capillary: 159 mg/dL — ABNORMAL HIGH (ref 65–99)

## 2016-12-07 LAB — PHOSPHORUS: Phosphorus: 5.5 mg/dL — ABNORMAL HIGH (ref 2.5–4.6)

## 2016-12-07 LAB — MAGNESIUM: Magnesium: 1.9 mg/dL (ref 1.7–2.4)

## 2016-12-07 LAB — PROCALCITONIN: PROCALCITONIN: 42.93 ng/mL

## 2016-12-07 MED ORDER — LEVETIRACETAM 500 MG/5ML IV SOLN
500.0000 mg | Freq: Two times a day (BID) | INTRAVENOUS | Status: DC
  Administered 2016-12-07: 500 mg via INTRAVENOUS
  Filled 2016-12-07 (×3): qty 5

## 2016-12-07 MED ORDER — SODIUM CHLORIDE 0.9% FLUSH
10.0000 mL | Freq: Two times a day (BID) | INTRAVENOUS | Status: DC
  Administered 2016-12-07: 20 mL

## 2016-12-07 MED ORDER — CHLORHEXIDINE GLUCONATE 0.12% ORAL RINSE (MEDLINE KIT)
15.0000 mL | Freq: Two times a day (BID) | OROMUCOSAL | Status: DC
  Administered 2016-12-07: 15 mL via OROMUCOSAL

## 2016-12-07 MED ORDER — LORAZEPAM 2 MG/ML IJ SOLN: 2.0000 mg | INTRAMUSCULAR | Status: DC | PRN

## 2016-12-07 MED ORDER — ORAL CARE MOUTH RINSE
15.0000 mL | OROMUCOSAL | Status: DC
  Administered 2016-12-07 (×2): 15 mL via OROMUCOSAL

## 2016-12-07 MED ORDER — SODIUM CHLORIDE 0.9% FLUSH: 10.0000 mL | INTRAVENOUS | Status: DC | PRN

## 2016-12-07 MED FILL — Medication: Qty: 1 | Status: AC

## 2016-12-10 ENCOUNTER — Telehealth: Payer: Self-pay

## 2016-12-10 NOTE — Telephone Encounter (Signed)
error 

## 2016-12-10 NOTE — Telephone Encounter (Signed)
Received Death Certificate . Placed on [place] desk.

## 2016-12-11 LAB — CULTURE, BLOOD (ROUTINE X 2)
Culture: NO GROWTH
Culture: NO GROWTH

## 2016-12-30 NOTE — Progress Notes (Signed)
Patient with increased work of breathing, with accessory and abdominal muscle use. No sedation or analgesic infusing at this time, RASS -4 with goal of -1. Marda Stalker NP at bedside and ordered that patient could received her prn fentanyl to improve work of breathing. Fentanyl administered. Team will continue to monitor.

## 2016-12-30 NOTE — Progress Notes (Signed)
Patient awaiitng last rites by priest after which patient will be extubated and transitioned to comfort care per Dr. Alva Garnet orders. Family at bedside. Team will continue to monitor.

## 2016-12-30 NOTE — Progress Notes (Signed)
*  PRELIMINARY RESULTS* Echocardiogram 2D Echocardiogram has been performed.  Sherrie Sport December 18, 2016, 10:17 AM

## 2016-12-30 NOTE — Progress Notes (Signed)
Patient extubated to comfort care with RN, Geraldo Docker RT, and Marda Stalker NP at bedside. Family at bedside (daughter, son in law, granddaughter). Patient currently resting comfortably on room air. Confirmed with Ms. Melene Muller NP that patient to keep keppra order even with comfort care. Patient also with prn ativan if needed for seizures.  [place] had been notified prior to last rites by priest and patient was not brain dead nor a candidate for DCD. RN to call with cardiac time of death. Team will continue to monitor.

## 2016-12-30 NOTE — Progress Notes (Signed)
[place] responded to a request from RN to secure "Last Rites" for Pt. Daughter, son-in-law, and granddaughter are bedside. Family requested prayer from [facility], which was provided. [place] contacted local Idelle Crouch who will deliver the last rites. [facility] is available for follow up as needed.    12/24/16 1300  Clinical Encounter Type  Visited With Patient;Patient and family together  Visit Type Initial;Spiritual support;Critical Care  Referral From Family;Nurse  Consult/Referral To Chaplain  Spiritual Encounters  Spiritual Needs Prayer;Ritual;Emotional;Grief support

## 2016-12-30 NOTE — Progress Notes (Signed)
Spoke with [facility].  Gave referral to [employer].  Referral number is [id number].  Spoke with Shanon Brow from [facility] and at this time pt is not a candidate but please call back if pt becomes comfort care for reevaluation.

## 2016-12-30 NOTE — Progress Notes (Signed)
Initial Nutrition Assessment  DOCUMENTATION CODES:   Not applicable  INTERVENTION:  - Nutrition plan of care discussed at ICU rounds no plan to initiate EN per MD at this time - RD will continue to assess  NUTRITION DIAGNOSIS:   Inadequate oral intake related to inability to eat as evidenced by NPO status.  GOAL:   Patient will meet greater than or equal to 90% of their needs  MONITOR:   Weight trends, Vent status  REASON FOR ASSESSMENT:   Ventilator    ASSESSMENT:   76 y.o. Female with PMH with COPD, CAD, and HTN presented to ER with respiratory arrest  secondary to AECOPD and bilateral pneumothorax and cardiac arrest, likely secondary to respiratory arrest. Pt was intubated upon arrival to ER initated on TTM.   Discussed pt at ICU rounds today.  Per MD, there is no plan to initiate EN at this time. RD to monitor for changes in nutritional plan of care. Pt is currently intubated on ventilator support Pt on normothermic protocol planned to be rewarmed at 1535 today MV: 10.3 mL/hr  Temp (24hrs), Avg:96.3 F (35.7 C), Min:93.5 F (34.2 C), Max:97.7 F (36.5 C)  Pt has not historians at bedside so unable to collect pt nutritional history  Per chart review of recorded weight history, pt seems to have gained weight  Wt Readings from Last 10 Encounters:  12/26/2016 140 lb (63.5 kg)  11/23/15 136 lb 9.6 oz (62 kg)  03/07/15 138 lb (62.6 kg)   Pt is hypotensive, requiring 3 pressors   S/p bilateral chest tube placement  Labs reviewed; CBG (180-241), K (5.5) Medications reviewed; Solu-medrol, Pepcid   Nutrition-focused physical exam not completed at this time.  Diet Order:  Diet NPO time specified  Skin:  Reviewed, no issues  Last BM:  PTA  Height:   Ht Readings from Last 1 Encounters:  12/15/2016 5\' 2"  (1.575 m)    Weight:   Wt Readings from Last 1 Encounters:  12/03/2016 140 lb (63.5 kg)    Ideal Body Weight:  50 kg  BMI:  Body mass index is 25.61  kg/m.  Estimated Nutritional Needs:   Kcal:  1233  Protein:  95-125 grams  Fluid:  >/= 1.2 L/d  EDUCATION NEEDS:   Education needs no appropriate at this time  [employer]

## 2016-12-30 NOTE — Progress Notes (Signed)
[facility] responded to an OR for End of life. Family is appreciative of the follow up, and stated the priest has performed the last rite. Family did not indicate a need for further spiritual care at this time.

## 2016-12-30 NOTE — Progress Notes (Signed)
Patient passed away at 15:07. Confirmed by Apolinar Junes RN and Adalberto Ill RN. Daughter, son in law, and granddaughter at bedside. [place] notified, patient not a candidate for eye donation. Still awaiting tissue donation decision.

## 2016-12-30 NOTE — Progress Notes (Signed)
Pt now in afib.  HR will increase to 140's and will drop into the 40s then increase into the 120s and back down again.  Now pt's pupils are not equal.  The right pupil is a 2 and the left pupil is a 4.  They are both still reactive however.

## 2016-12-30 NOTE — Progress Notes (Signed)
[facility] Cardiology [facility] Encounter Note  Patient: Autumn Moore / Admit Date: 12/24/2016 / Date of Encounter: 12-25-2016, 6:11 AM   Subjective: Patient is not responsive today despite no evidence of use of sedatives. Patient has significant neurologic abnormalities consistent with brain injury. Patient has minimal elevation of troponin more consistent with demand ischemia rather than acute coronary syndrome or myocardial infarction. Patient has had some rhythm disturbances including supraventricular tachycardia sinus tachycardia as well as bradycardia occurring. Amiodarone has caused more bradycardia and therefore has been discontinued. Currently the patient does not have any primary source of cardiac etiology rest  Review of Systems: Not assess  Objective: Telemetry: Sinus tachycardia Physical Exam: Blood pressure (!) 108/48, pulse (!) 54, temperature 97 F (36.1 C), temperature source Other (Comment), resp. rate (!) 22, height 5\' 2"  (1.575 m), weight 63.5 kg (140 lb), SpO2 95 %. Body mass index is 25.61 kg/m. General: Well developed, well nourished, in no acute distress. But with some neurologic abnormalities Head: Normocephalic, atraumatic, sclera non-icteric, no xanthomas, nares are without discharge. Neck: No apparent masses Lungs: Intubated with no wheezes, some rhonchi, no rales , no crackles   Heart: Regular rate and rhythm, normal S1 S2, no murmur, no rub, no gallop, PMI is normal size and placement, carotid upstroke normal without bruit, jugular venous pressure normal Abdomen: Soft,  non-distended with normoactive bowel sounds. No hepatosplenomegaly. Abdominal aorta is normal size without bruit Extremities: No edema, no clubbing, no cyanosis, no ulcers,  Peripheral: 2+ radial, 2+ femoral, 2+ dorsal pedal pulses Neuro: Not Alert and oriented. Moves all extremities spontaneously. Psych:  Does not Responds to questions appropriately with a normal affect.   Intake/Output  Summary (Last 24 hours) at 25-Dec-2016 0611 Last data filed at 12/28/2016 2325  Gross per 24 hour  Intake              601 ml  Output               63 ml  Net              538 ml    Inpatient Medications:  . amiodarone  150 mg Intravenous Once  . budesonide (PULMICORT) nebulizer solution  0.25 mg Nebulization BID  . ceFEPime (MAXIPIME) IV  1 g Intravenous Q24H  . famotidine (PEPCID) IV  20 mg Intravenous Q2000  . fentaNYL (SUBLIMAZE) injection  50 mcg Intravenous Once  . heparin  5,000 Units Subcutaneous Q8H  . insulin aspart  0-15 Units Subcutaneous Q4H  . levalbuterol  0.63 mg Nebulization Q4H  . methylPREDNISolone (SOLU-MEDROL) injection  40 mg Intravenous Q12H  . norepinephrine (LEVOPHED) Adult infusion  0-40 mcg/min Intravenous Once  . vancomycin  750 mg Intravenous Q36H   Infusions:  . sodium chloride 10 mL/hr at 12/25/16 0003  . amiodarone Stopped (12-25-2016 0419)  . fentaNYL    . norepinephrine (LEVOPHED) Adult infusion 8 mcg/min (December 25, 2016 0443)  . norepinephrine (LEVOPHED) Adult infusion    . phenylephrine (NEO-SYNEPHRINE) Adult infusion 95 mcg/min (December 25, 2016 0529)  . vasopressin (PITRESSIN) infusion - *FOR SHOCK* 0.03 Units/min (12/23/2016 1736)    Labs:  Recent Labs  12/14/2016 1540 12/08/2016 1852 12-25-2016 0247  NA  --  137 137  K  --  4.5  4.5 4.5  CL  --  99* 101  CO2  --  26 25  GLUCOSE  --  294* 235*  BUN  --  65* 75*  CREATININE  --  2.33* 2.49*  CALCIUM  --  7.5* 7.5*  MG 2.3 1.8 1.9  PHOS 8.9*  --  5.5*    Recent Labs  12/11/2016 1151 December 17, 2016 0247  AST 4,056* 11,943*  ALT 2,190* 4,588*  ALKPHOS 105 124  BILITOT 0.8 2.8*  PROT 6.0* 5.4*  ALBUMIN 2.5* 2.4*    Recent Labs  12/01/2016 1151 December 17, 2016 0247  WBC 7.9 14.4*  NEUTROABS 3.8  --   HGB 12.7 11.3*  HCT 41.0 34.9*  MCV 97.2 91.2  PLT 116* 158    Recent Labs  12/17/2016 1151 11/30/2016 1340 12/21/2016 1852 12-17-16 0038  TROPONINI 0.06* 0.07* 0.42* 0.64*   Invalid input(s): POCBNP No  results for input(s): HGBA1C in the last 72 hours.   Weights: Filed Weights   12/29/2016 1216  Weight: 63.5 kg (140 lb)     Radiology/Studies:  Dg Chest 1 View  Result Date: 12/05/2016 CLINICAL DATA:  Chest tube insertion, former smoking history EXAM: CHEST 1 VIEW COMPARISON:  Chest x-ray of 12/19/2016 FINDINGS: Bilateral chest tubes are present and no pneumothorax is evident. Diffuse interstitial and airspace opacities remain throughout the lungs. Tip of endotracheal tube is approximately 1.9 cm above the carina. NG tube extends below the hemidiaphragm. IMPRESSION: 1. Bilateral chest tubes are now present with no evidence of residual pneumothorax. 2. Diffuse interstitial and alveolar opacities remain throughout the lungs. Electronically Signed   By: Ivar Drape M.D.   On: 12/16/2016 16:42   Dg Chest 1 View  Result Date: 12/04/2016 CLINICAL DATA:  Check endotracheal tube placement EXAM: CHEST 1 VIEW COMPARISON:  11/23/2015 FINDINGS: Cardiac shadow is within normal limits. Diffuse fibrotic changes are noted particularly throughout the right lung as well as the left lower lung. These changes have progressed in the interval from the prior exam. This may also represent some mild acute on chronic infiltrate. The endotracheal tube is noted in satisfactory position approximately 3.9 cm above the carina. Small bilateral pneumothoraces are noted of uncertain chronicity. No other focal abnormality is noted. IMPRESSION: ET tube as described. Small bilateral pleural effusions. Diffuse chronic appearing fibrotic changes throughout the right lung as well as the left base. Possibility of acute on chronic infiltrate cannot be totally excluded. Small bilateral pneumothoraces of uncertain chronicity. Critical Value/emergent results were called by telephone at the time of interpretation on 12/05/2016 at 12:35 pm to Dr. Larae Grooms , who verbally acknowledged these results. Electronically Signed   By: Inez Catalina M.D.    On: 12/10/2016 12:36   Dg Abd 1 View  Result Date: 12/01/2016 CLINICAL DATA:  Evaluate OG tube placement EXAM: ABDOMEN - 1 VIEW COMPARISON:  Portable chest x-ray of 12/03/2016 FINDINGS: The tip of the OG tube extends in to the region of the distal antrum of the stomach. The bowel gas pattern is nonspecific. IMPRESSION: The tip of the OG tube is in the region of the distal antrum of the stomach. Electronically Signed   By: Ivar Drape M.D.   On: 12/01/2016 16:44   Dg Chest Port 1 View  Result Date: 12/17/16 CLINICAL DATA:  Initial evaluation for acute respiratory failure. EXAM: PORTABLE CHEST 1 VIEW COMPARISON:  Prior radiograph from 12/06/2016. FINDINGS: Patient remains intubated with the tip of an endotracheal tube positioned approximately 11 mm above the carina. Enteric tube courses in the the abdomen. Right IJ approach central venous catheter in place with tip overlying the distal SVC. Defibrillator pads overlie the chest. Cardiac and mediastinal silhouettes are stable. Aortic atherosclerosis. Bilateral chest tubes remain in place, similar position. Slight  interval worsening and bilateral perihilar and bibasilar opacities. Worsened edema is favored. No pleural effusion. No pneumothorax. No acute osseous abnormality. IMPRESSION: 1. Support apparatus in satisfactory position. 2. Slight interval worsening in bilateral perihilar and bibasilar opacities. Worsened edema/congestion is favored. Electronically Signed   By: Jeannine Boga M.D.   On: 01/01/17 05:33   Dg Chest Port 1 View  Result Date: 12/18/2016 CLINICAL DATA:  Central line placement EXAM: PORTABLE CHEST - 1 VIEW COMPARISON:  Earlier film of the same day FINDINGS: Interval placement of right IJ central venous catheter, tip mid SVC. No pneumothorax. Bilateral chest tubes remain in place. Endotracheal tube and nasogastric tube stable in position. Presumed mediastinal drain markers project over the lower aspect of the cardiac silhouette.  Drains remain. Perihilar interstitial and airspace infiltrates or edema right greater than left, largely stable. Heart size and mediastinal contours are within normal limits. Atheromatous aorta. No effusion. Visualized bones unremarkable. IMPRESSION: 1. Central line to mid SVC.  No pneumothorax. 2. Little change in bilateral infiltrates or edema. 3. Remainder of Support hardware stable in position. Electronically Signed   By: Lucrezia Europe M.D.   On: 12/03/2016 18:57     Assessment and Recommendation  76 y.o. female with history of chronic obstructive pulmonary disease and essential hypertension having a cardiopulmonary arrest with anoxic brain injury now with no apparent significant recovery over a 24-hour period without evidence of myocardial infarction or acute coronary syndrome or other new cardiac dysrhythmias suggesting a primary cardiac source of the problem. 1. Continue supportive care of the patient's acute arrest 2. Discontinuation of amiodarone due to induction of bradycardia 3. No further cardiac diagnostics necessary at this time due to no apparent primary cardiac cause of arrest 4. Call if further questions  Signed, Serafina Royals M.D. FACC

## 2016-12-30 NOTE — Progress Notes (Signed)
Confirmed with Dr. Alva Garnet in rounds that patient's MAP goal is 85 (not 70, like duplicate levophed order stated). Team will continue to monitor.

## 2016-12-30 NOTE — Discharge Summary (Signed)
DEATH SUMMARY  DATE OF ADMISSION:  12-12-2016  DATE OF DISCHARGE/DEATH:  12/13/16  ADMISSION DIAGNOSES:   Acute on chronic respiratory failure Respiratory arrest AECOPD  Bilateral pneumothoraces - likely due to chest compressions Diffuse bilateral pulmonary infiltrates Cardiac Arrest deemed to be secondary to respiratory arrest  Hypotension  Hx of HTN and CAD  Oliguric AKI Hyperkalemia  Lactic acidosis  Elevated liver enzymes Suspected post anoxic encephalopathy   DISCHARGE DIAGNOSES:   Severe post anoxic encephalopathy Acute on chronic respiratory failure Respiratory arrest AECOPD  Bilateral pneumothoraces - likely due to chest compressions Diffuse bilateral pulmonary infiltrates Cardiac Arrest deemed to be secondary to respiratory arrest  Hypotension  Hx of HTN and CAD  Oliguric AKI Hyperkalemia  Lactic acidosis  Elevated liver enzymes   PRESENTATION:   Pt was admitted via the ED to [facility] service with the following HPI and the above admission diagnoses:  This is a 76 yo female with a PMH of 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  She presented to [facility] ER 12/13/[facility] following respiratory and cardiac arrest.  Per ER notes, EMS was notified due to pt having respiratory distress it is unclear who notified EMS.  Upon EMS arrival the pt was apneic and pulseless with an initial rhythm of asystole, therefore CPR was initiated by EMS with ROSC following 22 mins of CPR. Pt returned to NSR, BP 81/52, and HR 84.  It is estimated possible total downtime was 1 hr prior to EMS arrival.  In route to the ER pt lost her pulse again cardiac rhythm vfibb on monitor, therefore CPR initiated by EMS with ROSC 2-3 mins cardiac rhythm returned to NSR.  She received a total of 7 epi and 1 atropine total by EMS during codes.  Upon arrival to the ER she was subsequently mechanically intubated, received 1 gram calcium, and  code sepsis was initiated.  CXR revealed bilateral pneumothorax, therefore PCCM emergently placed bilateral chest tubes.  PCCM contacted to admit pt to ICU for acute on chronic respiratory failure secondary to AECOPD and bilateral pneumothorax, cardiac arrest secondary to respiratory arrest, hypotension requiring vasopressors, lactic acidosis, mixed respiratory and metabolic acidosis, hyperkalemia, elevated liver enzymes, and acute renal failure.   HOSPITAL COURSE:   She was intubated and resuscitated in ED. Bilateral chest tubes were placed. She was admitted to ICU under care of PCCM Service and hypothermia protocol (36 degree) was implemented. She remained dependent on high dose vasopressors and was oligo-anuric with worsening renal indices. On the morning following admission, an EEG was performed which revealed a burst-suppression pattern deemed to portend a poor prognosis for functional recovery. Her family was updated and all were in agreement that the pt would not want anything short of a functional recovery. It was agreed that further aggressive life sustaining measures were not in keeping with her previously stated directives. Full comfort care was undertaken and life-sustaining measures were discontinued.    Cause of death:  Anoxic brain injury, due to  Cardiac arrest, due to Respiratory arrest, due to  AECOPD  Contributing factors: Bilateral pneumothoraces AKI  Autopsy: No  Smoking: Yes  Merton Border, MD PCCM service Mobile [phone] Pager [phone] 12/11/2016

## 2016-12-30 DEATH — deceased

## 2018-07-13 IMAGING — DX DG CHEST 1V PORT
1 series · 1 of 1 positions shown · non-contrast
Comparison: Prior radiograph from 12/06/2016.

CLINICAL DATA: Initial evaluation for acute respiratory failure.

EXAM:
PORTABLE CHEST 1 VIEW

[chest ap]
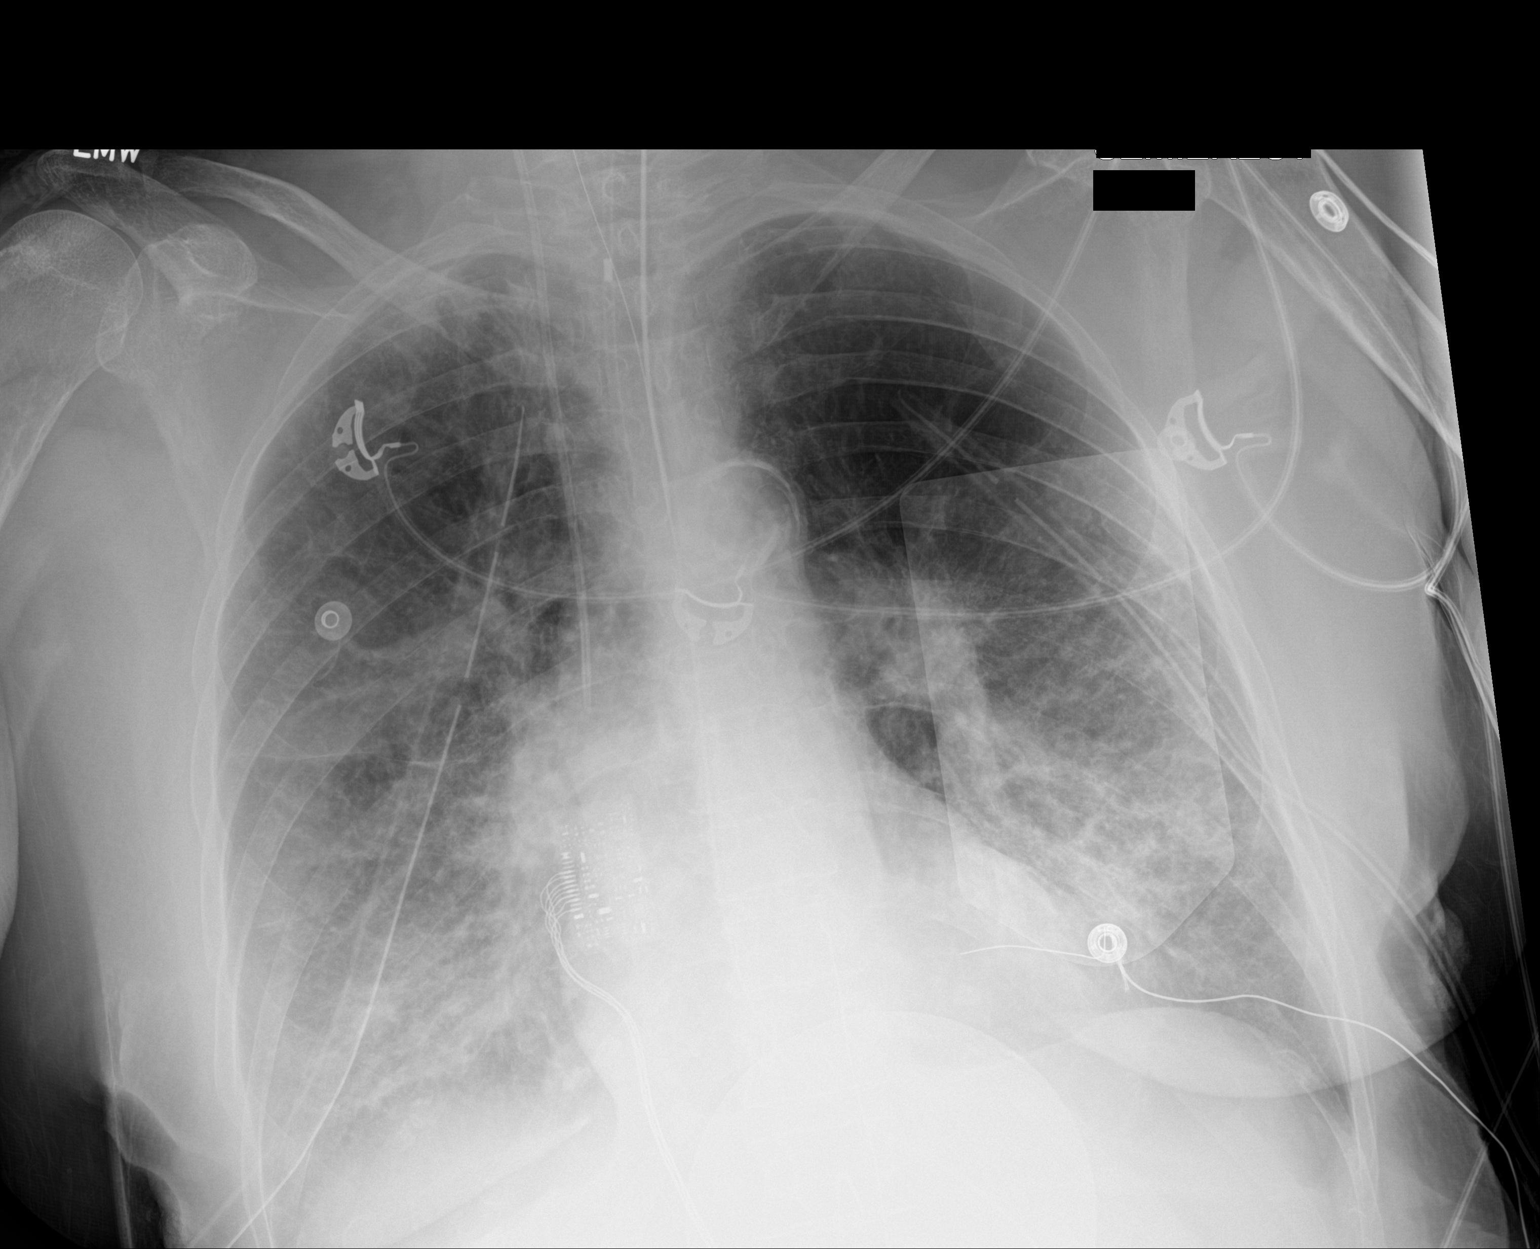

[1 of 1 positions shown; findings below may reference images not displayed]

FINDINGS: Patient remains intubated with the tip of an endotracheal tube
positioned approximately 11 mm above the carina. Enteric tube
courses in the the abdomen. Right IJ approach central venous
catheter in place with tip overlying the distal SVC. Defibrillator
pads overlie the chest. Cardiac and mediastinal silhouettes are
stable. Aortic atherosclerosis.

Bilateral chest tubes remain in place, similar position. Slight
interval worsening and bilateral perihilar and bibasilar opacities.
Worsened edema is favored. No pleural effusion. No pneumothorax.

No acute osseous abnormality.
IMPRESSION: 1. Support apparatus in satisfactory position.
2. Slight interval worsening in bilateral perihilar and bibasilar
opacities. Worsened edema/congestion is favored.
